# Patient Record
Sex: Female | Born: 1960 | Hispanic: No | Marital: Married | Smoking: Never smoker
Health system: Southern US, Community
[De-identification: ages and names within clinical notes are randomized; demographics above are authoritative.]

## PROBLEM LIST (undated history)

## (undated) DIAGNOSIS — M1990 Unspecified osteoarthritis, unspecified site: Secondary | ICD-10-CM

## (undated) DIAGNOSIS — F419 Anxiety disorder, unspecified: Secondary | ICD-10-CM

## (undated) DIAGNOSIS — T7840XA Allergy, unspecified, initial encounter: Secondary | ICD-10-CM

## (undated) DIAGNOSIS — E785 Hyperlipidemia, unspecified: Secondary | ICD-10-CM

## (undated) DIAGNOSIS — R011 Cardiac murmur, unspecified: Secondary | ICD-10-CM

## (undated) HISTORY — DX: Hyperlipidemia, unspecified: E78.5

## (undated) HISTORY — DX: Cardiac murmur, unspecified: R01.1

## (undated) HISTORY — DX: Allergy, unspecified, initial encounter: T78.40XA

## (undated) HISTORY — PX: ECTOPIC PREGNANCY SURGERY: SHX613

## (undated) HISTORY — PX: BILATERAL SALPINGECTOMY: SHX5743

## (undated) HISTORY — DX: Anxiety disorder, unspecified: F41.9

## (undated) HISTORY — DX: Unspecified osteoarthritis, unspecified site: M19.90

## (undated) HISTORY — PX: APPENDECTOMY: SHX54

## (undated) HISTORY — PX: TONSILLECTOMY: SUR1361

---

## 2009-01-03 ENCOUNTER — Encounter: Admission: RE | Admit: 2009-01-03 | Discharge: 2009-01-03 | Payer: Self-pay | Admitting: Family Medicine
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
## 2009-04-05 ENCOUNTER — Encounter: Admission: RE | Admit: 2009-04-05 | Discharge: 2009-04-05 | Payer: Self-pay | Admitting: Family Medicine

## 2009-12-20 ENCOUNTER — Other Ambulatory Visit: Admission: RE | Admit: 2009-12-20 | Discharge: 2009-12-20 | Payer: Self-pay | Admitting: *Deleted

## 2015-07-14 ENCOUNTER — Other Ambulatory Visit (HOSPITAL_COMMUNITY)
Admission: RE | Admit: 2015-07-14 | Discharge: 2015-07-14 | Disposition: A | Discharge status: Home / Self Care | Payer: BLUE CROSS/BLUE SHIELD | Source: Ambulatory Visit | Attending: Obstetrics & Gynecology | Admitting: Obstetrics & Gynecology

## 2015-07-14 ENCOUNTER — Other Ambulatory Visit: Payer: Self-pay

## 2015-07-14 DIAGNOSIS — Z1151 Encounter for screening for human papillomavirus (HPV): Secondary | ICD-10-CM | POA: Diagnosis present

## 2015-07-14 DIAGNOSIS — Z1231 Encounter for screening mammogram for malignant neoplasm of breast: Secondary | ICD-10-CM

## 2015-07-14 DIAGNOSIS — Z01419 Encounter for gynecological examination (general) (routine) without abnormal findings: Secondary | ICD-10-CM | POA: Insufficient documentation

## 2015-08-02 ENCOUNTER — Ambulatory Visit
Admission: RE | Admit: 2015-08-02 | Discharge: 2015-08-02 | Disposition: A | Discharge status: Home / Self Care | Payer: BLUE CROSS/BLUE SHIELD | Source: Ambulatory Visit

## 2015-08-02 DIAGNOSIS — Z1231 Encounter for screening mammogram for malignant neoplasm of breast: Secondary | ICD-10-CM

## 2015-08-04 ENCOUNTER — Other Ambulatory Visit: Payer: Self-pay | Admitting: Obstetrics & Gynecology

## 2015-08-04 DIAGNOSIS — R928 Other abnormal and inconclusive findings on diagnostic imaging of breast: Secondary | ICD-10-CM

## 2015-08-10 ENCOUNTER — Ambulatory Visit
Admission: RE | Admit: 2015-08-10 | Discharge: 2015-08-10 | Disposition: A | Discharge status: Home / Self Care | Payer: BLUE CROSS/BLUE SHIELD | Source: Ambulatory Visit | Attending: Obstetrics & Gynecology | Admitting: Obstetrics & Gynecology

## 2015-08-10 DIAGNOSIS — R928 Other abnormal and inconclusive findings on diagnostic imaging of breast: Secondary | ICD-10-CM

## 2019-10-19 ENCOUNTER — Ambulatory Visit: Payer: BLUE CROSS/BLUE SHIELD | Attending: Internal Medicine

## 2019-10-19 ENCOUNTER — Other Ambulatory Visit: Payer: BLUE CROSS/BLUE SHIELD

## 2019-10-19 DIAGNOSIS — Z20822 Contact with and (suspected) exposure to covid-19: Secondary | ICD-10-CM

## 2019-10-20 LAB — NOVEL CORONAVIRUS, NAA: SARS-CoV-2, NAA: NOT DETECTED

## 2020-08-29 DIAGNOSIS — Z1231 Encounter for screening mammogram for malignant neoplasm of breast: Secondary | ICD-10-CM | POA: Diagnosis not present

## 2020-09-27 DIAGNOSIS — R9431 Abnormal electrocardiogram [ECG] [EKG]: Secondary | ICD-10-CM | POA: Diagnosis not present

## 2020-09-27 DIAGNOSIS — R002 Palpitations: Secondary | ICD-10-CM | POA: Diagnosis not present

## 2020-09-29 DIAGNOSIS — L578 Other skin changes due to chronic exposure to nonionizing radiation: Secondary | ICD-10-CM | POA: Diagnosis not present

## 2020-09-29 DIAGNOSIS — L821 Other seborrheic keratosis: Secondary | ICD-10-CM | POA: Diagnosis not present

## 2020-09-29 DIAGNOSIS — L818 Other specified disorders of pigmentation: Secondary | ICD-10-CM | POA: Diagnosis not present

## 2020-09-29 DIAGNOSIS — D229 Melanocytic nevi, unspecified: Secondary | ICD-10-CM | POA: Diagnosis not present

## 2020-11-09 DIAGNOSIS — E782 Mixed hyperlipidemia: Secondary | ICD-10-CM | POA: Diagnosis not present

## 2020-11-09 DIAGNOSIS — R079 Chest pain, unspecified: Secondary | ICD-10-CM | POA: Diagnosis not present

## 2020-11-09 DIAGNOSIS — R0600 Dyspnea, unspecified: Secondary | ICD-10-CM | POA: Diagnosis not present

## 2020-11-11 DIAGNOSIS — R002 Palpitations: Secondary | ICD-10-CM | POA: Diagnosis not present

## 2020-11-11 DIAGNOSIS — R079 Chest pain, unspecified: Secondary | ICD-10-CM | POA: Diagnosis not present

## 2020-11-11 DIAGNOSIS — R0789 Other chest pain: Secondary | ICD-10-CM | POA: Diagnosis not present

## 2020-11-11 DIAGNOSIS — R0602 Shortness of breath: Secondary | ICD-10-CM | POA: Diagnosis not present

## 2020-11-11 DIAGNOSIS — R9431 Abnormal electrocardiogram [ECG] [EKG]: Secondary | ICD-10-CM | POA: Diagnosis not present

## 2020-11-15 DIAGNOSIS — R079 Chest pain, unspecified: Secondary | ICD-10-CM | POA: Diagnosis not present

## 2020-11-15 DIAGNOSIS — R0600 Dyspnea, unspecified: Secondary | ICD-10-CM | POA: Diagnosis not present

## 2020-11-16 DIAGNOSIS — R000 Tachycardia, unspecified: Secondary | ICD-10-CM | POA: Diagnosis not present

## 2020-11-16 DIAGNOSIS — R079 Chest pain, unspecified: Secondary | ICD-10-CM | POA: Diagnosis not present

## 2020-11-16 DIAGNOSIS — R002 Palpitations: Secondary | ICD-10-CM | POA: Diagnosis not present

## 2020-11-16 DIAGNOSIS — R0602 Shortness of breath: Secondary | ICD-10-CM | POA: Diagnosis not present

## 2020-12-07 DIAGNOSIS — R002 Palpitations: Secondary | ICD-10-CM | POA: Diagnosis not present

## 2020-12-08 DIAGNOSIS — I493 Ventricular premature depolarization: Secondary | ICD-10-CM | POA: Diagnosis not present

## 2020-12-08 DIAGNOSIS — I491 Atrial premature depolarization: Secondary | ICD-10-CM | POA: Diagnosis not present

## 2021-02-04 DIAGNOSIS — Z8739 Personal history of other diseases of the musculoskeletal system and connective tissue: Secondary | ICD-10-CM | POA: Diagnosis not present

## 2021-02-04 DIAGNOSIS — S8251XA Displaced fracture of medial malleolus of right tibia, initial encounter for closed fracture: Secondary | ICD-10-CM | POA: Diagnosis not present

## 2021-02-04 DIAGNOSIS — Y999 Unspecified external cause status: Secondary | ICD-10-CM | POA: Diagnosis not present

## 2021-02-04 DIAGNOSIS — M25571 Pain in right ankle and joints of right foot: Secondary | ICD-10-CM | POA: Diagnosis not present

## 2021-02-04 DIAGNOSIS — R600 Localized edema: Secondary | ICD-10-CM | POA: Diagnosis not present

## 2021-02-04 DIAGNOSIS — W1839XA Other fall on same level, initial encounter: Secondary | ICD-10-CM | POA: Diagnosis not present

## 2021-02-06 DIAGNOSIS — S93491A Sprain of other ligament of right ankle, initial encounter: Secondary | ICD-10-CM | POA: Diagnosis not present

## 2021-03-01 DIAGNOSIS — R29898 Other symptoms and signs involving the musculoskeletal system: Secondary | ICD-10-CM | POA: Diagnosis not present

## 2021-03-01 DIAGNOSIS — M25671 Stiffness of right ankle, not elsewhere classified: Secondary | ICD-10-CM | POA: Diagnosis not present

## 2021-03-01 DIAGNOSIS — Z7409 Other reduced mobility: Secondary | ICD-10-CM | POA: Diagnosis not present

## 2021-03-01 DIAGNOSIS — M25571 Pain in right ankle and joints of right foot: Secondary | ICD-10-CM | POA: Diagnosis not present

## 2021-03-09 DIAGNOSIS — M25671 Stiffness of right ankle, not elsewhere classified: Secondary | ICD-10-CM | POA: Diagnosis not present

## 2021-03-09 DIAGNOSIS — Z7409 Other reduced mobility: Secondary | ICD-10-CM | POA: Diagnosis not present

## 2021-03-09 DIAGNOSIS — R29898 Other symptoms and signs involving the musculoskeletal system: Secondary | ICD-10-CM | POA: Diagnosis not present

## 2021-03-09 DIAGNOSIS — M25571 Pain in right ankle and joints of right foot: Secondary | ICD-10-CM | POA: Diagnosis not present

## 2021-03-10 DIAGNOSIS — S93491D Sprain of other ligament of right ankle, subsequent encounter: Secondary | ICD-10-CM | POA: Diagnosis not present

## 2021-03-16 DIAGNOSIS — Z7409 Other reduced mobility: Secondary | ICD-10-CM | POA: Diagnosis not present

## 2021-03-16 DIAGNOSIS — M25571 Pain in right ankle and joints of right foot: Secondary | ICD-10-CM | POA: Diagnosis not present

## 2021-03-16 DIAGNOSIS — R29898 Other symptoms and signs involving the musculoskeletal system: Secondary | ICD-10-CM | POA: Diagnosis not present

## 2021-03-16 DIAGNOSIS — M25671 Stiffness of right ankle, not elsewhere classified: Secondary | ICD-10-CM | POA: Diagnosis not present

## 2021-03-29 DIAGNOSIS — M25571 Pain in right ankle and joints of right foot: Secondary | ICD-10-CM | POA: Diagnosis not present

## 2021-03-29 DIAGNOSIS — R29898 Other symptoms and signs involving the musculoskeletal system: Secondary | ICD-10-CM | POA: Diagnosis not present

## 2021-03-29 DIAGNOSIS — M25671 Stiffness of right ankle, not elsewhere classified: Secondary | ICD-10-CM | POA: Diagnosis not present

## 2021-03-29 DIAGNOSIS — Z7409 Other reduced mobility: Secondary | ICD-10-CM | POA: Diagnosis not present

## 2021-04-05 DIAGNOSIS — M25671 Stiffness of right ankle, not elsewhere classified: Secondary | ICD-10-CM | POA: Diagnosis not present

## 2021-04-05 DIAGNOSIS — M25571 Pain in right ankle and joints of right foot: Secondary | ICD-10-CM | POA: Diagnosis not present

## 2021-04-05 DIAGNOSIS — R29898 Other symptoms and signs involving the musculoskeletal system: Secondary | ICD-10-CM | POA: Diagnosis not present

## 2021-04-05 DIAGNOSIS — Z7409 Other reduced mobility: Secondary | ICD-10-CM | POA: Diagnosis not present

## 2021-04-12 DIAGNOSIS — M25671 Stiffness of right ankle, not elsewhere classified: Secondary | ICD-10-CM | POA: Diagnosis not present

## 2021-04-12 DIAGNOSIS — R29898 Other symptoms and signs involving the musculoskeletal system: Secondary | ICD-10-CM | POA: Diagnosis not present

## 2021-04-12 DIAGNOSIS — Z7409 Other reduced mobility: Secondary | ICD-10-CM | POA: Diagnosis not present

## 2021-04-12 DIAGNOSIS — M25571 Pain in right ankle and joints of right foot: Secondary | ICD-10-CM | POA: Diagnosis not present

## 2021-04-17 DIAGNOSIS — M25571 Pain in right ankle and joints of right foot: Secondary | ICD-10-CM | POA: Diagnosis not present

## 2021-04-17 DIAGNOSIS — R29898 Other symptoms and signs involving the musculoskeletal system: Secondary | ICD-10-CM | POA: Diagnosis not present

## 2021-04-17 DIAGNOSIS — M25671 Stiffness of right ankle, not elsewhere classified: Secondary | ICD-10-CM | POA: Diagnosis not present

## 2021-04-17 DIAGNOSIS — Z7409 Other reduced mobility: Secondary | ICD-10-CM | POA: Diagnosis not present

## 2021-04-19 DIAGNOSIS — M25671 Stiffness of right ankle, not elsewhere classified: Secondary | ICD-10-CM | POA: Diagnosis not present

## 2021-04-19 DIAGNOSIS — M25571 Pain in right ankle and joints of right foot: Secondary | ICD-10-CM | POA: Diagnosis not present

## 2021-04-19 DIAGNOSIS — Z7409 Other reduced mobility: Secondary | ICD-10-CM | POA: Diagnosis not present

## 2021-04-19 DIAGNOSIS — R29898 Other symptoms and signs involving the musculoskeletal system: Secondary | ICD-10-CM | POA: Diagnosis not present

## 2021-06-05 DIAGNOSIS — Z7409 Other reduced mobility: Secondary | ICD-10-CM | POA: Diagnosis not present

## 2021-06-05 DIAGNOSIS — R29898 Other symptoms and signs involving the musculoskeletal system: Secondary | ICD-10-CM | POA: Diagnosis not present

## 2021-06-05 DIAGNOSIS — M25671 Stiffness of right ankle, not elsewhere classified: Secondary | ICD-10-CM | POA: Diagnosis not present

## 2021-06-05 DIAGNOSIS — M25571 Pain in right ankle and joints of right foot: Secondary | ICD-10-CM | POA: Diagnosis not present

## 2021-06-19 DIAGNOSIS — F320 Major depressive disorder, single episode, mild: Secondary | ICD-10-CM | POA: Diagnosis not present

## 2021-06-19 DIAGNOSIS — F419 Anxiety disorder, unspecified: Secondary | ICD-10-CM | POA: Diagnosis not present

## 2021-06-19 DIAGNOSIS — Z23 Encounter for immunization: Secondary | ICD-10-CM | POA: Diagnosis not present

## 2021-06-19 DIAGNOSIS — Z1329 Encounter for screening for other suspected endocrine disorder: Secondary | ICD-10-CM | POA: Diagnosis not present

## 2021-06-19 DIAGNOSIS — Z0000 Encounter for general adult medical examination without abnormal findings: Secondary | ICD-10-CM | POA: Diagnosis not present

## 2021-06-19 DIAGNOSIS — E782 Mixed hyperlipidemia: Secondary | ICD-10-CM | POA: Diagnosis not present

## 2021-06-26 DIAGNOSIS — M1711 Unilateral primary osteoarthritis, right knee: Secondary | ICD-10-CM | POA: Diagnosis not present

## 2021-06-26 DIAGNOSIS — M25561 Pain in right knee: Secondary | ICD-10-CM | POA: Diagnosis not present

## 2021-07-20 DIAGNOSIS — G8929 Other chronic pain: Secondary | ICD-10-CM | POA: Diagnosis not present

## 2021-07-20 DIAGNOSIS — S83231A Complex tear of medial meniscus, current injury, right knee, initial encounter: Secondary | ICD-10-CM | POA: Diagnosis not present

## 2021-07-20 DIAGNOSIS — M1711 Unilateral primary osteoarthritis, right knee: Secondary | ICD-10-CM | POA: Diagnosis not present

## 2021-07-20 DIAGNOSIS — M25561 Pain in right knee: Secondary | ICD-10-CM | POA: Diagnosis not present

## 2021-07-31 DIAGNOSIS — S83231D Complex tear of medial meniscus, current injury, right knee, subsequent encounter: Secondary | ICD-10-CM | POA: Diagnosis not present

## 2021-08-17 HISTORY — PX: KNEE ARTHROSCOPY: SHX127

## 2021-09-01 DIAGNOSIS — X58XXXA Exposure to other specified factors, initial encounter: Secondary | ICD-10-CM | POA: Diagnosis not present

## 2021-09-01 DIAGNOSIS — S83231A Complex tear of medial meniscus, current injury, right knee, initial encounter: Secondary | ICD-10-CM | POA: Diagnosis not present

## 2021-09-01 DIAGNOSIS — M2391 Unspecified internal derangement of right knee: Secondary | ICD-10-CM | POA: Diagnosis not present

## 2021-09-07 DIAGNOSIS — R29898 Other symptoms and signs involving the musculoskeletal system: Secondary | ICD-10-CM | POA: Diagnosis not present

## 2021-09-07 DIAGNOSIS — Z7409 Other reduced mobility: Secondary | ICD-10-CM | POA: Diagnosis not present

## 2021-09-07 DIAGNOSIS — S83231D Complex tear of medial meniscus, current injury, right knee, subsequent encounter: Secondary | ICD-10-CM | POA: Diagnosis not present

## 2021-09-07 DIAGNOSIS — M25561 Pain in right knee: Secondary | ICD-10-CM | POA: Diagnosis not present

## 2021-09-28 DIAGNOSIS — M25561 Pain in right knee: Secondary | ICD-10-CM | POA: Diagnosis not present

## 2021-09-28 DIAGNOSIS — R29898 Other symptoms and signs involving the musculoskeletal system: Secondary | ICD-10-CM | POA: Diagnosis not present

## 2021-09-28 DIAGNOSIS — S83231D Complex tear of medial meniscus, current injury, right knee, subsequent encounter: Secondary | ICD-10-CM | POA: Diagnosis not present

## 2021-09-28 DIAGNOSIS — Z7409 Other reduced mobility: Secondary | ICD-10-CM | POA: Diagnosis not present

## 2021-12-18 DIAGNOSIS — E782 Mixed hyperlipidemia: Secondary | ICD-10-CM | POA: Diagnosis not present

## 2021-12-18 DIAGNOSIS — F419 Anxiety disorder, unspecified: Secondary | ICD-10-CM | POA: Diagnosis not present

## 2021-12-18 DIAGNOSIS — Z1231 Encounter for screening mammogram for malignant neoplasm of breast: Secondary | ICD-10-CM | POA: Diagnosis not present

## 2022-01-11 DIAGNOSIS — L818 Other specified disorders of pigmentation: Secondary | ICD-10-CM | POA: Diagnosis not present

## 2022-01-11 DIAGNOSIS — D229 Melanocytic nevi, unspecified: Secondary | ICD-10-CM | POA: Diagnosis not present

## 2022-01-11 DIAGNOSIS — L821 Other seborrheic keratosis: Secondary | ICD-10-CM | POA: Diagnosis not present

## 2022-01-11 DIAGNOSIS — L578 Other skin changes due to chronic exposure to nonionizing radiation: Secondary | ICD-10-CM | POA: Diagnosis not present

## 2022-02-23 DIAGNOSIS — E781 Pure hyperglyceridemia: Secondary | ICD-10-CM | POA: Diagnosis not present

## 2022-02-23 DIAGNOSIS — Z6831 Body mass index (BMI) 31.0-31.9, adult: Secondary | ICD-10-CM | POA: Diagnosis not present

## 2022-02-23 DIAGNOSIS — E669 Obesity, unspecified: Secondary | ICD-10-CM | POA: Diagnosis not present

## 2022-02-23 DIAGNOSIS — F411 Generalized anxiety disorder: Secondary | ICD-10-CM | POA: Diagnosis not present

## 2022-02-23 DIAGNOSIS — F5101 Primary insomnia: Secondary | ICD-10-CM | POA: Diagnosis not present

## 2022-03-05 DIAGNOSIS — E781 Pure hyperglyceridemia: Secondary | ICD-10-CM | POA: Diagnosis not present

## 2022-03-05 DIAGNOSIS — F5101 Primary insomnia: Secondary | ICD-10-CM | POA: Diagnosis not present

## 2022-03-05 DIAGNOSIS — Z6831 Body mass index (BMI) 31.0-31.9, adult: Secondary | ICD-10-CM | POA: Diagnosis not present

## 2022-03-05 DIAGNOSIS — E669 Obesity, unspecified: Secondary | ICD-10-CM | POA: Diagnosis not present

## 2022-03-26 DIAGNOSIS — F5101 Primary insomnia: Secondary | ICD-10-CM | POA: Diagnosis not present

## 2022-03-26 DIAGNOSIS — E669 Obesity, unspecified: Secondary | ICD-10-CM | POA: Diagnosis not present

## 2022-03-26 DIAGNOSIS — Z1231 Encounter for screening mammogram for malignant neoplasm of breast: Secondary | ICD-10-CM | POA: Diagnosis not present

## 2022-03-26 DIAGNOSIS — R7309 Other abnormal glucose: Secondary | ICD-10-CM | POA: Diagnosis not present

## 2022-03-26 DIAGNOSIS — R631 Polydipsia: Secondary | ICD-10-CM | POA: Diagnosis not present

## 2022-03-26 DIAGNOSIS — R350 Frequency of micturition: Secondary | ICD-10-CM | POA: Diagnosis not present

## 2022-03-26 DIAGNOSIS — Z6831 Body mass index (BMI) 31.0-31.9, adult: Secondary | ICD-10-CM | POA: Diagnosis not present

## 2022-03-26 DIAGNOSIS — F411 Generalized anxiety disorder: Secondary | ICD-10-CM | POA: Diagnosis not present

## 2022-03-26 DIAGNOSIS — R110 Nausea: Secondary | ICD-10-CM | POA: Diagnosis not present

## 2022-04-23 DIAGNOSIS — F411 Generalized anxiety disorder: Secondary | ICD-10-CM | POA: Diagnosis not present

## 2022-04-23 DIAGNOSIS — R0789 Other chest pain: Secondary | ICD-10-CM | POA: Diagnosis not present

## 2022-04-23 DIAGNOSIS — F5101 Primary insomnia: Secondary | ICD-10-CM | POA: Diagnosis not present

## 2022-05-16 DIAGNOSIS — I209 Angina pectoris, unspecified: Secondary | ICD-10-CM | POA: Diagnosis not present

## 2022-05-16 DIAGNOSIS — E785 Hyperlipidemia, unspecified: Secondary | ICD-10-CM | POA: Diagnosis not present

## 2022-05-16 DIAGNOSIS — E669 Obesity, unspecified: Secondary | ICD-10-CM | POA: Diagnosis not present

## 2022-05-16 DIAGNOSIS — F411 Generalized anxiety disorder: Secondary | ICD-10-CM | POA: Diagnosis not present

## 2022-05-16 DIAGNOSIS — Z6831 Body mass index (BMI) 31.0-31.9, adult: Secondary | ICD-10-CM | POA: Diagnosis not present

## 2022-05-25 DIAGNOSIS — R079 Chest pain, unspecified: Secondary | ICD-10-CM | POA: Diagnosis not present

## 2022-05-25 DIAGNOSIS — I2583 Coronary atherosclerosis due to lipid rich plaque: Secondary | ICD-10-CM | POA: Diagnosis not present

## 2022-05-25 DIAGNOSIS — R072 Precordial pain: Secondary | ICD-10-CM | POA: Diagnosis not present

## 2022-05-25 DIAGNOSIS — E785 Hyperlipidemia, unspecified: Secondary | ICD-10-CM | POA: Diagnosis not present

## 2022-05-25 DIAGNOSIS — I2510 Atherosclerotic heart disease of native coronary artery without angina pectoris: Secondary | ICD-10-CM | POA: Diagnosis not present

## 2022-05-25 DIAGNOSIS — Z6830 Body mass index (BMI) 30.0-30.9, adult: Secondary | ICD-10-CM | POA: Diagnosis not present

## 2022-05-25 DIAGNOSIS — E669 Obesity, unspecified: Secondary | ICD-10-CM | POA: Diagnosis not present

## 2022-05-25 DIAGNOSIS — F419 Anxiety disorder, unspecified: Secondary | ICD-10-CM | POA: Diagnosis not present

## 2022-05-25 DIAGNOSIS — R0789 Other chest pain: Secondary | ICD-10-CM | POA: Diagnosis not present

## 2022-06-20 DIAGNOSIS — E785 Hyperlipidemia, unspecified: Secondary | ICD-10-CM | POA: Diagnosis not present

## 2022-06-20 DIAGNOSIS — I209 Angina pectoris, unspecified: Secondary | ICD-10-CM | POA: Diagnosis not present

## 2022-07-05 ENCOUNTER — Encounter: Payer: Self-pay | Admitting: Family Medicine

## 2022-07-05 ENCOUNTER — Ambulatory Visit (INDEPENDENT_AMBULATORY_CARE_PROVIDER_SITE_OTHER): Payer: BC Managed Care – PPO | Admitting: Family Medicine

## 2022-07-05 VITALS — BP 112/74 | HR 58 | Temp 97.8°F | Ht 61.0 in | Wt 165.2 lb

## 2022-07-05 DIAGNOSIS — F411 Generalized anxiety disorder: Secondary | ICD-10-CM

## 2022-07-05 DIAGNOSIS — R600 Localized edema: Secondary | ICD-10-CM | POA: Diagnosis not present

## 2022-07-05 DIAGNOSIS — E7800 Pure hypercholesterolemia, unspecified: Secondary | ICD-10-CM

## 2022-07-05 DIAGNOSIS — F5101 Primary insomnia: Secondary | ICD-10-CM

## 2022-07-05 DIAGNOSIS — I209 Angina pectoris, unspecified: Secondary | ICD-10-CM

## 2022-07-05 DIAGNOSIS — Z6831 Body mass index (BMI) 31.0-31.9, adult: Secondary | ICD-10-CM

## 2022-07-05 DIAGNOSIS — E6609 Other obesity due to excess calories: Secondary | ICD-10-CM

## 2022-07-05 MED ORDER — MIRTAZAPINE 7.5 MG PO TABS: 7.5000 mg | ORAL_TABLET | Freq: Every day | ORAL | 1 refills | Status: DC

## 2022-07-05 MED ORDER — AMLODIPINE BESYLATE 2.5 MG PO TABS: 2.5000 mg | ORAL_TABLET | Freq: Every day | ORAL | 1 refills | Status: DC

## 2022-07-05 MED ORDER — AZELASTINE HCL 0.1 % NA SOLN: 2.0000 | Freq: Two times a day (BID) | NASAL | 12 refills | Status: DC

## 2022-07-05 MED ORDER — ATORVASTATIN CALCIUM 40 MG PO TABS: 40.0000 mg | ORAL_TABLET | Freq: Every day | ORAL | 3 refills | Status: DC

## 2022-07-05 MED ORDER — ESCITALOPRAM OXALATE 20 MG PO TABS: 20.0000 mg | ORAL_TABLET | Freq: Every day | ORAL | 3 refills | Status: DC

## 2022-07-05 NOTE — Patient Instructions (Addendum)
Welcome to [facility]! It was a pleasure meeting you today.  As discussed, Please schedule a 1 month follow up visit today.  For mirtazipine-try 1 at night for sleep and if not work, can try 2  PLEASE NOTE:  If you had any LAB tests please let us know if you have not heard back within a few days. You may see your results on MyChart before we have a chance to review them but we will give you a call once they are reviewed by [place]. If we ordered any REFERRALS today, please let us know if you have not heard from their office within the next week.  Let us know through MyChart if you are needing REFILLS, or have your pharmacy send [place] the request. You can also use MyChart to communicate with me or any office staff.  Please try these tips to maintain a healthy lifestyle:  Eat most of your calories during the day when you are active. Eliminate processed foods including packaged sweets (pies, cakes, cookies), reduce intake of potatoes, white bread, white pasta, and white rice. Look for whole grain options, oat flour or almond flour.  Each meal should contain half fruits/vegetables, one quarter protein, and one quarter carbs (no bigger than a computer mouse).  Cut down on sweet beverages. This includes juice, soda, and sweet tea. Also watch fruit intake, though this is a healthier sweet option, it still contains natural sugar! Limit to 3 servings daily.  Drink at least 1 glass of water with each meal and aim for at least 8 glasses per day  Exercise at least 150 minutes every week.

## 2022-07-05 NOTE — Progress Notes (Signed)
New Patient Office Visit  Subjective:  Patient ID: Debra Martinez, female    DOB: 1960-11-25  Age: 61 y.o. MRN: [id number]  CC:  Chief Complaint  Patient presents with   Establish Care    Need new pcp Had coffee, no food     HPI Debra Martinez presents for new pt-changed ins and wants MD.  From [place]  Cp at times-not heartburn.  Had cath-min CAD. Can be at rest or w/activity. No rad.  Some DOE.  Bp can be up at times(145/90).    Pt is active.  Walks and elliptical.  Now sit ups    Chronic, mild edema on R at times Obesity-good diet/exercise-just can't lose wt.  Very frustrated. Phentermine-anx, wegovy-too expensive. Doesn't want to drive to [place]. Willing to wait for [place].  Anxiety-lexapro works well.  But situation in [place] getting to her.  Can't sleep.  Hydroxyzine not help in past. Ambien-doesn't want. Trazodone not work. No SI Hld-12/22/21  tc 146 trig 234 hdl 39 ldl47   Past Medical History:  Diagnosis Date   Allergy    Anxiety    Arthritis    Heart murmur    Hyperlipidemia     Past Surgical History:  Procedure Laterality Date   APPENDECTOMY     BILATERAL SALPINGECTOMY     ECTOPIC PREGNANCY SURGERY     KNEE ARTHROSCOPY Right 08/2021   TONSILLECTOMY      Family History  Problem Relation Age of Onset   Drug abuse Father    Alcohol abuse Father    Heart disease Maternal Grandmother    Cancer Maternal Grandmother    Heart disease Maternal Grandfather    Hyperlipidemia Paternal Grandfather    Heart disease Paternal Grandfather     Social History   Socioeconomic History   Marital status: Married    Spouse name: Not on file   Number of children: 3   Years of education: Not on file   Highest education level: Not on file  Occupational History   Not on file  Tobacco Use   Smoking status: Never   Smokeless tobacco: Never  Vaping Use   Vaping Use: Never used  Substance and Sexual Activity   Alcohol use: Yes    Alcohol/week: 3.0 - 4.0 standard drinks of  alcohol    Types: 3 - 4 Glasses of wine per week   Drug use: Never   Sexual activity: Yes    Birth control/protection: None  Other Topics Concern   Not on file  Social History Narrative   1 biological,  2 adopted.    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   Social Determinants of Health   Financial Resource Strain: Not on file  Food Insecurity: Not on file  Transportation Needs: Not on file  Physical Activity: Not on file  Stress: Not on file  Social Connections: Not on file  Intimate Partner Violence: Not on file    ROS  ROS: Gen: no fever, chills  Skin: no rash, itching ENT: no ear pain, ear drainage, nasal congestion, rhinorrhea, sinus pressure, sore throat.  Snores.  Flonase not working Eyes: no blurry vision, double vision Resp: no cough, wheeze,SOB CV: no CP, palpitations, LE edema,  GI: no heartburn, n/v/d/c, abd pain GU: no dysuria, urgency, frequency, hematuria MSK: R knee and hands arthritis Neuro: no dizziness, headache, weakness, vertigo Psych: HPI  Objective:   Today's Vitals: BP 112/74   Pulse (!) 58   Temp 97.8 F (36.6 C) (Temporal)   Ht  5\' 1"  (1.549 m)   Wt 165 lb 4 oz (75 kg)   SpO2 94%   BMI 31.22 kg/m   Physical Exam  Gen: WDWN NAD HEENT: NCAT, conjunctiva not injected, sclera nonicteric TM WNL B, OP moist, no exudates  NECK:  supple, no thyromegaly, no nodes, no carotid bruits CARDIAC: RRR, S1S2+, no murmur. DP 2+B LUNGS: CTAB. No wheezes ABDOMEN:  BS+, soft, NTND, No HSM, no masses EXT: 1+ RLE edema MSK: no gross abnormalities.  NEURO: A&O x3.  CN II-XII intact.  PSYCH: anxious mood. Good eye contact   Assessment & Plan:   Problem List Items Addressed This Visit       Cardiovascular and Mediastinum   Angina pectoris (HCC)   Relevant Medications   nitroGLYCERIN (NITROSTAT) 0.4 MG SL tablet   atorvastatin (LIPITOR) 40 MG tablet   amLODipine (NORVASC) 2.5 MG tablet     Other   Pure hypercholesterolemia - Primary    Relevant Medications   nitroGLYCERIN (NITROSTAT) 0.4 MG SL tablet   atorvastatin (LIPITOR) 40 MG tablet   amLODipine (NORVASC) 2.5 MG tablet   GAD (generalized anxiety disorder)   Relevant Medications   escitalopram (LEXAPRO) 20 MG tablet   mirtazapine (REMERON) 7.5 MG tablet   Primary insomnia   Localized edema   Other Visit Diagnoses     Class 1 obesity due to excess calories with serious comorbidity and body mass index (BMI) of 31.0 to 31.9 in adult          Angina-had cath-mild non-obs dz.  Occ ntg.  Discussed amlodipine 2.5mg  to keep blood vessels open-she would like to try. HLD-chronic.  Mixed.  Controlled on lipitor 40mg -renewed.  Reviewed labs on her phone from April and Sept   LDL<50.  Trigs a little up-she has good diet/exercise RLE edema-watch for inc on amlodipine(doubt as low dose) GAD-chronic.  Stable on lexapro 20mg -renewed.  Flaring some d/t events in May Insomnia-chronic.  No relief w/meds in HPI nor OTC nor herbal teas.  Will trial mirtazipine 7.5mg .  can inc to 15mg . Snoring-try astelin Obesity w/co-morbidities-SE to meds or cost.  Pt exercises, good diet.  Very frustrated.  Wants to go to wt loss clinic in [place]-aware of wait list.  F/u 1 mo  Outpatient Encounter Medications as of 07/05/2022  Medication Sig   amLODipine (NORVASC) 2.5 MG tablet Take 1 tablet (2.5 mg total) by mouth daily.   azelastine (ASTELIN) 0.1 % nasal spray Place 2 sprays into both nostrils 2 (two) times daily.   DOXYLAMINE SUCCINATE PO Take by mouth.   fexofenadine (ALLEGRA) 180 MG tablet Take by mouth.   fluticasone (FLONASE) 50 MCG/ACT nasal spray Place into both nostrils daily.   Magnesium 400 MG TABS Take by mouth.   mirtazapine (REMERON) 7.5 MG tablet Take 1 tablet (7.5 mg total) by mouth at bedtime.   nitroGLYCERIN (NITROSTAT) 0.4 MG SL tablet Place under the tongue.   [DISCONTINUED] atorvastatin (LIPITOR) 40 MG tablet Take by mouth.   [DISCONTINUED] escitalopram (LEXAPRO) 20 MG  tablet Take 20 mg by mouth daily.   atorvastatin (LIPITOR) 40 MG tablet Take 1 tablet (40 mg total) by mouth daily.   escitalopram (LEXAPRO) 20 MG tablet Take 1 tablet (20 mg total) by mouth daily.   No facility-administered encounter medications on file as of 07/05/2022.    Follow-up: Return in about 4 weeks (around 08/02/2022) for insomnia and cp.   , MD

## 2022-07-10 ENCOUNTER — Emergency Department (HOSPITAL_BASED_OUTPATIENT_CLINIC_OR_DEPARTMENT_OTHER): Payer: BC Managed Care – PPO

## 2022-07-10 ENCOUNTER — Other Ambulatory Visit: Payer: Self-pay

## 2022-07-10 ENCOUNTER — Emergency Department (HOSPITAL_BASED_OUTPATIENT_CLINIC_OR_DEPARTMENT_OTHER)
Admission: EM | Admit: 2022-07-10 | Discharge: 2022-07-10 | Disposition: A | Discharge status: Home / Self Care | Payer: BC Managed Care – PPO | Attending: Emergency Medicine | Admitting: Emergency Medicine

## 2022-07-10 DIAGNOSIS — S0990XA Unspecified injury of head, initial encounter: Secondary | ICD-10-CM | POA: Insufficient documentation

## 2022-07-10 DIAGNOSIS — S161XXA Strain of muscle, fascia and tendon at neck level, initial encounter: Secondary | ICD-10-CM | POA: Insufficient documentation

## 2022-07-10 DIAGNOSIS — Y92410 Unspecified street and highway as the place of occurrence of the external cause: Secondary | ICD-10-CM | POA: Insufficient documentation

## 2022-07-10 DIAGNOSIS — M549 Dorsalgia, unspecified: Secondary | ICD-10-CM | POA: Insufficient documentation

## 2022-07-10 DIAGNOSIS — I6789 Other cerebrovascular disease: Secondary | ICD-10-CM | POA: Diagnosis not present

## 2022-07-10 DIAGNOSIS — Z041 Encounter for examination and observation following transport accident: Secondary | ICD-10-CM | POA: Diagnosis not present

## 2022-07-10 MED ORDER — METHOCARBAMOL 500 MG PO TABS: 500.0000 mg | ORAL_TABLET | Freq: Three times a day (TID) | ORAL | 0 refills | Status: DC | PRN

## 2022-07-10 MED ORDER — KETOROLAC TROMETHAMINE 30 MG/ML IJ SOLN
15.0000 mg | Freq: Once | INTRAMUSCULAR | Status: AC
  Administered 2022-07-10: 15 mg via INTRAMUSCULAR
  Filled 2022-07-10: qty 1

## 2022-07-10 MED ORDER — METHOCARBAMOL 500 MG PO TABS
500.0000 mg | ORAL_TABLET | Freq: Once | ORAL | Status: AC
  Administered 2022-07-10: 500 mg via ORAL
  Filled 2022-07-10: qty 1
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
## 2022-07-10 NOTE — ED Notes (Signed)
Reviewed AVS/discharge instruction with patient. Time allotted for and all questions answered. Patient is agreeable for d/c and escorted to ed exit by staff.  
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
## 2022-07-12 ENCOUNTER — Ambulatory Visit (INDEPENDENT_AMBULATORY_CARE_PROVIDER_SITE_OTHER): Payer: BC Managed Care – PPO | Admitting: Family

## 2022-07-12 ENCOUNTER — Encounter: Payer: Self-pay | Admitting: Family

## 2022-07-12 VITALS — BP 132/85 | HR 55 | Temp 97.8°F | Ht 61.0 in | Wt 168.1 lb

## 2022-07-12 DIAGNOSIS — M542 Cervicalgia: Secondary | ICD-10-CM | POA: Diagnosis not present

## 2022-07-12 MED ORDER — NAPROXEN 500 MG PO TABS: 500.0000 mg | ORAL_TABLET | Freq: Two times a day (BID) | ORAL | 0 refills | Status: DC
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
## 2022-07-12 NOTE — Patient Instructions (Addendum)
It was very nice to see you today!   I have sent Naproxen to help with your neck and back pain.  Also recommend trying a heating pad for up to 30 minutes several times per day.   I would try the Methocarbamol (Robaxin) muscle relaxer given to you by the ER providers, take 1 pill after eating up to 3 times per day - start at home first as has a possiblity of making you drowsy.  Let us know if you are not getting better with your pain.       PLEASE NOTE:  If you had any lab tests please let us know if you have not heard back within a few days. You may see your results on MyChart before we have a chance to review them but we will give you a call once they are reviewed by [place]. If we ordered any referrals today, please let us know if you have not heard from their office within the next week.

## 2022-08-06 ENCOUNTER — Encounter: Payer: Self-pay | Admitting: Family Medicine

## 2022-08-06 ENCOUNTER — Ambulatory Visit (INDEPENDENT_AMBULATORY_CARE_PROVIDER_SITE_OTHER): Payer: BC Managed Care – PPO | Admitting: Family Medicine

## 2022-08-06 VITALS — BP 130/74 | HR 61 | Temp 98.5°F | Ht 61.0 in | Wt 165.2 lb

## 2022-08-06 DIAGNOSIS — M542 Cervicalgia: Secondary | ICD-10-CM

## 2022-08-06 DIAGNOSIS — I209 Angina pectoris, unspecified: Secondary | ICD-10-CM

## 2022-08-06 DIAGNOSIS — F5101 Primary insomnia: Secondary | ICD-10-CM

## 2022-08-06 MED ORDER — MIRTAZAPINE 15 MG PO TABS: 15.0000 mg | ORAL_TABLET | Freq: Every day | ORAL | 3 refills | Status: DC

## 2022-08-06 MED ORDER — METHYLPREDNISOLONE ACETATE 80 MG/ML IJ SUSP
80.0000 mg | Freq: Once | INTRAMUSCULAR | Status: AC
  Administered 2022-08-06: 80 mg via INTRAMUSCULAR

## 2022-08-06 NOTE — Patient Instructions (Signed)
It was very nice to see you today!  Happy holidays!  Referring to physical therapy  Increase mirtazipine to 15mg .    PLEASE NOTE:  If you had any lab tests please let know if you have not heard back within a few days. You may see your results on MyChart before we have a chance to review them but we will give you a call once they are reviewed by [place]. If we ordered any referrals today, please let us know if you have not heard from their office within the next week.   Please try these tips to maintain a healthy lifestyle:  Eat most of your calories during the day when you are active. Eliminate processed foods including packaged sweets (pies, cakes, cookies), reduce intake of potatoes, white bread, white pasta, and white rice. Look for whole grain options, oat flour or almond flour.  Each meal should contain half fruits/vegetables, one quarter protein, and one quarter carbs (no bigger than a computer mouse).  Cut down on sweet beverages. This includes juice, soda, and sweet tea. Also watch fruit intake, though this is a healthier sweet option, it still contains natural sugar! Limit to 3 servings daily.  Drink at least 1 glass of water with each meal and aim for at least 8 glasses per day  Exercise at least 150 minutes every week.
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
## 2022-08-16 DIAGNOSIS — E785 Hyperlipidemia, unspecified: Secondary | ICD-10-CM | POA: Diagnosis not present

## 2022-08-16 DIAGNOSIS — R072 Precordial pain: Secondary | ICD-10-CM | POA: Diagnosis not present

## 2022-08-16 DIAGNOSIS — F419 Anxiety disorder, unspecified: Secondary | ICD-10-CM | POA: Diagnosis not present

## 2022-08-20 DIAGNOSIS — Z6829 Body mass index (BMI) 29.0-29.9, adult: Secondary | ICD-10-CM | POA: Diagnosis not present

## 2022-08-20 DIAGNOSIS — Z7182 Exercise counseling: Secondary | ICD-10-CM | POA: Diagnosis not present

## 2022-08-20 DIAGNOSIS — E663 Overweight: Secondary | ICD-10-CM | POA: Diagnosis not present

## 2022-08-20 DIAGNOSIS — Z13228 Encounter for screening for other metabolic disorders: Secondary | ICD-10-CM | POA: Diagnosis not present

## 2022-08-20 DIAGNOSIS — Z713 Dietary counseling and surveillance: Secondary | ICD-10-CM | POA: Diagnosis not present

## 2022-08-20 DIAGNOSIS — F3289 Other specified depressive episodes: Secondary | ICD-10-CM | POA: Diagnosis not present

## 2022-08-21 ENCOUNTER — Encounter: Payer: Self-pay | Admitting: Family Medicine

## 2022-08-21 ENCOUNTER — Ambulatory Visit (INDEPENDENT_AMBULATORY_CARE_PROVIDER_SITE_OTHER): Payer: BC Managed Care – PPO | Admitting: Family Medicine

## 2022-08-21 VITALS — BP 130/70 | HR 62 | Temp 98.3°F | Ht 62.0 in | Wt 162.1 lb

## 2022-08-21 DIAGNOSIS — I209 Angina pectoris, unspecified: Secondary | ICD-10-CM | POA: Diagnosis not present

## 2022-08-21 DIAGNOSIS — J0100 Acute maxillary sinusitis, unspecified: Secondary | ICD-10-CM

## 2022-08-21 MED ORDER — METHOCARBAMOL 500 MG PO TABS: 500.0000 mg | ORAL_TABLET | Freq: Three times a day (TID) | ORAL | 0 refills | Status: DC | PRN

## 2022-08-21 MED ORDER — DOXYCYCLINE HYCLATE 100 MG PO TABS: 100.0000 mg | ORAL_TABLET | Freq: Two times a day (BID) | ORAL | 0 refills | Status: DC

## 2022-08-21 NOTE — Progress Notes (Signed)
Subjective:     Patient ID: Debra Martinez, female    DOB: Mar 20, 1961, 61 y.o.   MRN: [id number]  Chief Complaint  Patient presents with   Ear Pain    Left ear pressure   Nasal Congestion   Headache    Left sided   Fever    Had a slight temp this morning, took Tylenol    HPI Congestion since 11/25.  Husb had as well.  His covid was neg x2.  A lot of coughing, some sob/weak/tired.  Taking theraflu cough syrup. Past 2 days L ear pressure, L sided HA.  Slight temp this am 37.9 and took tylenol about 3 hrs ago. 100.2. No rash.  Some diarrhea for 1.5 days and again today. Some nausea. No vomit.   Health Maintenance Due  Topic Date Due   HIV Screening  Never done   Hepatitis C Screening  Never done   DTaP/Tdap/Td (1 - Tdap) Never done   PAP SMEAR-Modifier  Never done   MAMMOGRAM  08/01/2017    Past Medical History:  Diagnosis Date   Allergy    Anxiety    Arthritis    Heart murmur    Hyperlipidemia     Past Surgical History:  Procedure Laterality Date   APPENDECTOMY     BILATERAL SALPINGECTOMY     ECTOPIC PREGNANCY SURGERY     KNEE ARTHROSCOPY Right 08/2021   TONSILLECTOMY      Outpatient Medications Prior to Visit  Medication Sig Dispense Refill   atorvastatin (LIPITOR) 40 MG tablet Take 1 tablet (40 mg total) by mouth daily. 90 tablet 3   azelastine (ASTELIN) 0.1 % nasal spray Place 2 sprays into both nostrils 2 (two) times daily. 30 mL 12   DOXYLAMINE SUCCINATE PO Take by mouth.     escitalopram (LEXAPRO) 20 MG tablet Take 1 tablet (20 mg total) by mouth daily. 90 tablet 3   fexofenadine (ALLEGRA) 180 MG tablet Take by mouth.     fluticasone (FLONASE) 50 MCG/ACT nasal spray Place into both nostrils daily.     isosorbide mononitrate (IMDUR) 30 MG 24 hr tablet Take by mouth.     Magnesium 400 MG TABS Take by mouth.     mirtazapine (REMERON) 15 MG tablet Take 1 tablet (15 mg total) by mouth at bedtime. 90 tablet 3   naproxen (NAPROSYN) 500 MG tablet Take 1 tablet  (500 mg total) by mouth 2 (two) times daily with a meal. 30 tablet 0   nitroGLYCERIN (NITROSTAT) 0.4 MG SL tablet Place under the tongue.     methocarbamol (ROBAXIN) 500 MG tablet Take 1 tablet (500 mg total) by mouth every 8 (eight) hours as needed for muscle spasms. 20 tablet 0   No facility-administered medications prior to visit.    Allergies  Allergen Reactions   Penicillin G Anaphylaxis   Sulfa Antibiotics Other (See Comments)    Other reaction(s): Dizziness (intolerance)   ROS neg/noncontributory except as noted HPI/below R neck spasms coming back      Objective:     BP 130/70   Pulse 62   Temp 98.3 F (36.8 C) (Temporal)   Ht 5\' 2"  (1.575 m)   Wt 162 lb 2 oz (73.5 kg)   SpO2 96%   BMI 29.65 kg/m  Wt Readings from Last 3 Encounters:  08/21/22 162 lb 2 oz (73.5 kg)  08/06/22 165 lb 4 oz (75 kg)  07/12/22 168 lb 2 oz (76.3 kg)    Physical Exam  Gen: WDWN NAD HEENT: NCAT, conjunctiva not injected, sclera nonicteric TM WNL B, OP moist, no exudates  very tender to percussion L max/frontal sinus NECK:  supple, no thyromegaly, no nodes, no carotid bruits CARDIAC: RRR, S1S2+, no murmur.  LUNGS: CTAB. No wheezes EXT:  no edema MSK: no gross abnormalities.  NEURO: A&O x3.  CN II-XII intact.  PSYCH: normal mood. Good eye contact.  Anxious.  Twitching some     Assessment & Plan:   Problem List Items Addressed This Visit   None Visit Diagnoses     Acute non-recurrent maxillary sinusitis    -  Primary   Relevant Medications   doxycycline (VIBRA-TABS) 100 MG tablet      L maxillary sinusitis-doxy 100mg  bid. Coronary spasm-better on Imdur.  Wants to xfer to .   Referral done  Meds ordered this encounter  Medications   doxycycline (VIBRA-TABS) 100 MG tablet    Sig: Take 1 tablet (100 mg total) by mouth 2 (two) times daily.    Dispense:  20 tablet    Refill:  0   methocarbamol (ROBAXIN) 500 MG tablet    Sig: Take 1 tablet (500 mg total) by mouth  every 8 (eight) hours as needed for muscle spasms.    Dispense:  20 tablet    Refill:  0    [employer], MD

## 2022-08-21 NOTE — Patient Instructions (Signed)
It was very nice to see you today!  Happy holidays!  Worse, not improving, let me know   PLEASE NOTE:  If you had any lab tests please let us know if you have not heard back within a few days. You may see your results on MyChart before we have a chance to review them but we will give you a call once they are reviewed by [place]. If we ordered any referrals today, please let us know if you have not heard from their office within the next week.   Please try these tips to maintain a healthy lifestyle:  Eat most of your calories during the day when you are active. Eliminate processed foods including packaged sweets (pies, cakes, cookies), reduce intake of potatoes, white bread, white pasta, and white rice. Look for whole grain options, oat flour or almond flour.  Each meal should contain half fruits/vegetables, one quarter protein, and one quarter carbs (no bigger than a computer mouse).  Cut down on sweet beverages. This includes juice, soda, and sweet tea. Also watch fruit intake, though this is a healthier sweet option, it still contains natural sugar! Limit to 3 servings daily.  Drink at least 1 glass of water with each meal and aim for at least 8 glasses per day  Exercise at least 150 minutes every week.

## 2022-08-22 ENCOUNTER — Ambulatory Visit: Payer: BC Managed Care – PPO | Admitting: Physical Therapy

## 2022-10-02 ENCOUNTER — Other Ambulatory Visit: Payer: Self-pay | Admitting: Family Medicine

## 2022-11-25 ENCOUNTER — Other Ambulatory Visit: Payer: Self-pay | Admitting: Family Medicine

## 2022-12-11 ENCOUNTER — Encounter: Payer: Self-pay | Admitting: Family Medicine

## 2022-12-11 ENCOUNTER — Ambulatory Visit: Payer: BC Managed Care – PPO | Admitting: Family Medicine

## 2022-12-11 VITALS — BP 118/80 | HR 81 | Temp 97.6°F | Ht 62.0 in | Wt 166.2 lb

## 2022-12-11 DIAGNOSIS — F4310 Post-traumatic stress disorder, unspecified: Secondary | ICD-10-CM

## 2022-12-11 DIAGNOSIS — F411 Generalized anxiety disorder: Secondary | ICD-10-CM

## 2022-12-11 DIAGNOSIS — J301 Allergic rhinitis due to pollen: Secondary | ICD-10-CM

## 2022-12-11 DIAGNOSIS — M542 Cervicalgia: Secondary | ICD-10-CM

## 2022-12-11 MED ORDER — IPRATROPIUM BROMIDE 0.06 % NA SOLN: 2.0000 | Freq: Four times a day (QID) | NASAL | 1 refills | Status: DC
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
## 2022-12-11 NOTE — Patient Instructions (Addendum)
Try claritin rather than allegra.     Add ipratroprium.    Referral sent to psych and PT  [facility]

## 2022-12-24 ENCOUNTER — Ambulatory Visit (INDEPENDENT_AMBULATORY_CARE_PROVIDER_SITE_OTHER): Payer: BC Managed Care – PPO | Admitting: Physical Therapy

## 2022-12-24 DIAGNOSIS — M546 Pain in thoracic spine: Secondary | ICD-10-CM

## 2022-12-24 DIAGNOSIS — M542 Cervicalgia: Secondary | ICD-10-CM
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
## 2022-12-25 ENCOUNTER — Ambulatory Visit: Payer: BC Managed Care – PPO | Admitting: Behavioral Health

## 2022-12-26 ENCOUNTER — Encounter: Payer: Self-pay | Admitting: Physical Therapy

## 2022-12-26 NOTE — Addendum Note (Signed)
Addended by: Sedalia Muta on: 12/26/2022 09:53 AM   Modules accepted: Orders

## 2023-01-03 ENCOUNTER — Encounter: Payer: BC Managed Care – PPO | Admitting: Physical Therapy

## 2023-01-10 ENCOUNTER — Encounter: Payer: Self-pay | Admitting: Physical Therapy

## 2023-01-10 ENCOUNTER — Ambulatory Visit (INDEPENDENT_AMBULATORY_CARE_PROVIDER_SITE_OTHER): Payer: BC Managed Care – PPO | Admitting: Physical Therapy

## 2023-01-10 DIAGNOSIS — M542 Cervicalgia: Secondary | ICD-10-CM

## 2023-01-10 DIAGNOSIS — M546 Pain in thoracic spine: Secondary | ICD-10-CM
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
## 2023-01-17 ENCOUNTER — Ambulatory Visit (INDEPENDENT_AMBULATORY_CARE_PROVIDER_SITE_OTHER): Payer: BC Managed Care – PPO | Admitting: Physical Therapy

## 2023-01-17 ENCOUNTER — Encounter: Payer: Self-pay | Admitting: Physical Therapy

## 2023-01-17 DIAGNOSIS — M542 Cervicalgia: Secondary | ICD-10-CM | POA: Diagnosis not present

## 2023-01-17 DIAGNOSIS — M546 Pain in thoracic spine: Secondary | ICD-10-CM | POA: Diagnosis not present
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
## 2023-01-23 ENCOUNTER — Other Ambulatory Visit: Payer: Self-pay | Admitting: Family Medicine

## 2023-01-24 ENCOUNTER — Ambulatory Visit (INDEPENDENT_AMBULATORY_CARE_PROVIDER_SITE_OTHER): Payer: BC Managed Care – PPO | Admitting: Physical Therapy

## 2023-01-24 ENCOUNTER — Encounter: Payer: Self-pay | Admitting: Physical Therapy

## 2023-01-24 DIAGNOSIS — M546 Pain in thoracic spine: Secondary | ICD-10-CM | POA: Diagnosis not present

## 2023-01-24 DIAGNOSIS — M542 Cervicalgia: Secondary | ICD-10-CM | POA: Diagnosis not present
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
## 2023-01-31 ENCOUNTER — Encounter: Payer: BC Managed Care – PPO | Admitting: Physical Therapy

## 2023-02-04 ENCOUNTER — Ambulatory Visit (INDEPENDENT_AMBULATORY_CARE_PROVIDER_SITE_OTHER): Payer: BC Managed Care – PPO | Admitting: Family Medicine

## 2023-02-04 ENCOUNTER — Encounter: Payer: Self-pay | Admitting: Family Medicine

## 2023-02-04 VITALS — BP 112/78 | HR 60 | Temp 98.6°F | Resp 18 | Ht 62.0 in | Wt 168.4 lb

## 2023-02-04 DIAGNOSIS — I209 Angina pectoris, unspecified: Secondary | ICD-10-CM | POA: Diagnosis not present

## 2023-02-04 DIAGNOSIS — F411 Generalized anxiety disorder: Secondary | ICD-10-CM

## 2023-02-04 DIAGNOSIS — J301 Allergic rhinitis due to pollen: Secondary | ICD-10-CM | POA: Diagnosis not present

## 2023-02-04 DIAGNOSIS — R600 Localized edema: Secondary | ICD-10-CM

## 2023-02-04 DIAGNOSIS — E7800 Pure hypercholesterolemia, unspecified: Secondary | ICD-10-CM

## 2023-02-04 MED ORDER — IPRATROPIUM BROMIDE 0.06 % NA SOLN: NASAL | 3 refills | Status: DC

## 2023-02-04 NOTE — Assessment & Plan Note (Signed)
Chronic.  Improved.  Continue Astelin nasal spray, Allegra 180 mg daily, Flonase, Atrovent nasal spray

## 2023-02-04 NOTE — Assessment & Plan Note (Signed)
Chronic.  Controlled.  Continue Lipitor 40 mg daily.  Will follow-up for annual physical

## 2023-02-04 NOTE — Assessment & Plan Note (Signed)
Chronic.  Patient not really wanting to take more medications.  Advised to do lemon water, compression stockings, elevation, continue to be active

## 2023-02-04 NOTE — Assessment & Plan Note (Signed)
Chronic.  Mostly controlled.  Continue Luvox 50 mg daily, escitalopram 20 mg daily, mirtazapine 15 mg at bedtime.  She declines counseling at this time.

## 2023-02-04 NOTE — Assessment & Plan Note (Signed)
Chronic.  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  Seeing cardiology.  Continue Imdur 30 mg daily.  Nitroglycerin as needed

## 2023-02-04 NOTE — Patient Instructions (Signed)
It was very nice to see you today!  Compression stockings  lemon water.     PLEASE NOTE:  If you had any lab tests please let us know if you have not heard back within a few days. You may see your results on MyChart before we have a chance to review them but we will give you a call once they are reviewed by [place]. If we ordered any referrals today, please let us know if you have not heard from their office within the next week.   Please try these tips to maintain a healthy lifestyle:  Eat most of your calories during the day when you are active. Eliminate processed foods including packaged sweets (pies, cakes, cookies), reduce intake of potatoes, white bread, white pasta, and white rice. Look for whole grain options, oat flour or almond flour.  Each meal should contain half fruits/vegetables, one quarter protein, and one quarter carbs (no bigger than a computer mouse).  Cut down on sweet beverages. This includes juice, soda, and sweet tea. Also watch fruit intake, though this is a healthier sweet option, it still contains natural sugar! Limit to 3 servings daily.  Drink at least 1 glass of water with each meal and aim for at least 8 glasses per day  Exercise at least 150 minutes every week.

## 2023-02-04 NOTE — Progress Notes (Signed)
Subjective:     Patient ID: Debra Martinez, female    DOB: August 13, 1961, 62 y.o.   MRN: [id number]  Chief Complaint  Patient presents with   Medical Management of Chronic Issues    6 months follow-up moods, etc     HPI  HLD-mixed.  On Lipitor 40 mg    Generalized anxiety disorder/PTSD-on luvox 50, Lexapro 20 and mirtazipine 15 mg.A lot going on-son graduating-just "trying to get through the school year".  No SI.  Tired and thirsty.   Allergic rhinitis- finally starting to ease off.  Atrovent working very well.   Pap 2 years ago.  Mamm due-she will sch.  Angina-nitroglycerin twice.  No major blockages but ?spams.  Seeing Card  Health Maintenance Due  Topic Date Due   HIV Screening  Never done   Hepatitis C Screening  Never done   DTaP/Tdap/Td (1 - Tdap) Never done   PAP SMEAR-Modifier  Never done   MAMMOGRAM  08/01/2017    Past Medical History:  Diagnosis Date   Allergy    Anxiety    Arthritis    Heart murmur    Hyperlipidemia     Past Surgical History:  Procedure Laterality Date   APPENDECTOMY     BILATERAL SALPINGECTOMY     ECTOPIC PREGNANCY SURGERY     KNEE ARTHROSCOPY Right 08/2021   TONSILLECTOMY       Current Outpatient Medications:    atorvastatin (LIPITOR) 40 MG tablet, Take 1 tablet (40 mg total) by mouth daily., Disp: 90 tablet, Rfl: 3   azelastine (ASTELIN) 0.1 % nasal spray, Place 2 sprays into both nostrils 2 (two) times daily., Disp: 30 mL, Rfl: 12   diclofenac Sodium (VOLTAREN) 1 % GEL, Apply topically., Disp: , Rfl:    DOXYLAMINE SUCCINATE PO, Take by mouth., Disp: , Rfl:    escitalopram (LEXAPRO) 20 MG tablet, Take 1 tablet (20 mg total) by mouth daily., Disp: 90 tablet, Rfl: 3   fexofenadine (ALLEGRA) 180 MG tablet, Take by mouth., Disp: , Rfl:    fluticasone (FLONASE) 50 MCG/ACT nasal spray, Place into both nostrils daily., Disp: , Rfl:    fluvoxaMINE (LUVOX) 50 MG tablet, Take 50 mg by mouth at bedtime., Disp: , Rfl:    isosorbide mononitrate  (IMDUR) 30 MG 24 hr tablet, Take by mouth., Disp: , Rfl:    Magnesium 400 MG TABS, Take by mouth., Disp: , Rfl:    methocarbamol (ROBAXIN) 500 MG tablet, TAKE 1 TABLET BY MOUTH EVERY 8 HOURS AS NEEDED FOR MUSCLE SPASM, Disp: 20 tablet, Rfl: 0   mirtazapine (REMERON) 15 MG tablet, Take 1 tablet (15 mg total) by mouth at bedtime., Disp: 90 tablet, Rfl: 3   naproxen (NAPROSYN) 500 MG tablet, Take 1 tablet (500 mg total) by mouth 2 (two) times daily with a meal., Disp: 30 tablet, Rfl: 0   nitroGLYCERIN (NITROSTAT) 0.4 MG SL tablet, Place under the tongue., Disp: , Rfl:    ipratropium (ATROVENT) 0.06 % nasal spray, USE 2 SPRAY(S) IN EACH NOSTRIL 4 TIMES DAILY, Disp: 45 mL, Rfl: 3  Allergies  Allergen Reactions   Penicillin G Anaphylaxis   Sulfa Antibiotics Other (See Comments)    Other reaction(s): Dizziness (intolerance)   ROS neg/noncontributory except as noted HPI/below Snoring-occasional Edema-mild.  No shortness of breath.     Objective:     BP 112/78   Pulse 60   Temp 98.6 F (37 C) (Temporal)   Resp 18   Ht 5\' 2"  (1.575  m)   Wt 168 lb 6 oz (76.4 kg)   SpO2 98%   BMI 30.80 kg/m  Wt Readings from Last 3 Encounters:  02/04/23 168 lb 6 oz (76.4 kg)  12/11/22 166 lb 4 oz (75.4 kg)  08/21/22 162 lb 2 oz (73.5 kg)    Physical Exam   Gen: WDWN NAD HEENT: NCAT, conjunctiva not injected, sclera nonicteric NECK:  supple, no thyromegaly, no nodes, no carotid bruits CARDIAC: RRR, S1S2+, no murmur. DP 2+B LUNGS: CTAB. No wheezes ABDOMEN:  BS+, soft, NTND, No HSM, no masses EXT:  tr edema MSK: no gross abnormalities.  NEURO: A&O x3.  CN II-XII intact.  PSYCH: normal mood. Good eye contact     Assessment & Plan:  Pure hypercholesterolemia Assessment & Plan: Chronic.  Controlled.  Continue Lipitor 40 mg daily.  Will follow-up for annual physical   Seasonal allergic rhinitis due to pollen Assessment & Plan: Chronic.  Improved.  Continue Astelin nasal spray, Allegra 180 mg  daily, Flonase, Atrovent nasal spray   GAD (generalized anxiety disorder) Assessment & Plan: Chronic.  Mostly controlled.  Continue Luvox 50 mg daily, escitalopram 20 mg daily, mirtazapine 15 mg at bedtime.  She declines counseling at this time.   Angina pectoris [facility]) Assessment & Plan: Chronic.  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  Seeing cardiology.  Continue Imdur 30 mg daily.  Nitroglycerin as needed   Localized edema Assessment & Plan: Chronic.  Patient not really wanting to take more medications.  Advised to do lemon water, compression stockings, elevation, continue to be active   Other orders -     Ipratropium Bromide; USE 2 SPRAY(S) IN EACH NOSTRIL 4 TIMES DAILY  Dispense: 45 mL; Refill: 3  Follow up Comprehensive Physical Exam (CPE) preventive care annual visit/labs soon.  Angelena Sole, MD

## 2023-02-07 ENCOUNTER — Encounter: Payer: BC Managed Care – PPO | Admitting: Physical Therapy

## 2023-02-27 ENCOUNTER — Encounter: Payer: Self-pay | Admitting: Family Medicine

## 2023-02-27 ENCOUNTER — Ambulatory Visit (INDEPENDENT_AMBULATORY_CARE_PROVIDER_SITE_OTHER): Payer: BC Managed Care – PPO | Admitting: Family Medicine

## 2023-02-27 VITALS — BP 118/60 | HR 78 | Temp 98.7°F | Resp 18 | Ht 62.0 in | Wt 167.2 lb

## 2023-02-27 DIAGNOSIS — Z1159 Encounter for screening for other viral diseases: Secondary | ICD-10-CM

## 2023-02-27 DIAGNOSIS — Z23 Encounter for immunization: Secondary | ICD-10-CM

## 2023-02-27 DIAGNOSIS — Z131 Encounter for screening for diabetes mellitus: Secondary | ICD-10-CM | POA: Diagnosis not present

## 2023-02-27 DIAGNOSIS — Z0000 Encounter for general adult medical examination without abnormal findings: Secondary | ICD-10-CM | POA: Diagnosis not present

## 2023-02-27 DIAGNOSIS — E7800 Pure hypercholesterolemia, unspecified: Secondary | ICD-10-CM | POA: Diagnosis not present

## 2023-02-27 LAB — COMPREHENSIVE METABOLIC PANEL
ALT: 24 U/L (ref 0–35)
AST: 23 U/L (ref 0–37)
Albumin: 4.6 g/dL (ref 3.5–5.2)
Alkaline Phosphatase: 61 U/L (ref 39–117)
BUN: 15 mg/dL (ref 6–23)
CO2: 27 mEq/L (ref 19–32)
Calcium: 9.6 mg/dL (ref 8.4–10.5)
Chloride: 108 mEq/L (ref 96–112)
Creatinine, Ser: 0.85 mg/dL (ref 0.40–1.20)
GFR: 72.94 mL/min (ref >60.00)
Glucose, Bld: 104 mg/dL — ABNORMAL HIGH (ref 70–99)
Potassium: 4.1 mEq/L (ref 3.5–5.1)
Sodium: 143 mEq/L (ref 135–145)
Total Bilirubin: 0.5 mg/dL (ref 0.2–1.2)
Total Protein: 6.7 g/dL (ref 6.0–8.3)

## 2023-02-27 LAB — CBC WITH DIFFERENTIAL/PLATELET
Basophils Absolute: 0 10*3/uL (ref 0.0–0.1)
Basophils Relative: 0.3 % (ref 0.0–3.0)
Eosinophils Absolute: 0 10*3/uL (ref 0.0–0.7)
Eosinophils Relative: 0.8 % (ref 0.0–5.0)
HCT: 43.7 % (ref 36.0–46.0)
Hemoglobin: 14.2 g/dL (ref 12.0–15.0)
Lymphocytes Relative: 22.1 % (ref 12.0–46.0)
Lymphs Abs: 1 10*3/uL (ref 0.7–4.0)
MCHC: 32.5 g/dL (ref 30.0–36.0)
MCV: 90.4 fl (ref 78.0–100.0)
Monocytes Absolute: 0.3 10*3/uL (ref 0.1–1.0)
Monocytes Relative: 5.9 % (ref 3.0–12.0)
Neutro Abs: 3.3 10*3/uL (ref 1.4–7.7)
Neutrophils Relative %: 70.9 % (ref 43.0–77.0)
Platelets: 149 10*3/uL — ABNORMAL LOW (ref 150.0–400.0)
RBC: 4.84 Mil/uL (ref 3.87–5.11)
RDW: 14 % (ref 11.5–15.5)
WBC: 4.7 10*3/uL (ref 4.0–10.5)

## 2023-02-27 LAB — LIPID PANEL
Cholesterol: 95 mg/dL (ref 0–200)
HDL: 40.2 mg/dL (ref >39.00)
LDL Cholesterol: 31 mg/dL (ref 0–99)
NonHDL: 54.34
Total CHOL/HDL Ratio: 2
Triglycerides: 119 mg/dL (ref 0.0–149.0)
VLDL: 23.8 mg/dL (ref 0.0–40.0)

## 2023-02-27 LAB — TSH: TSH: 1.16 u[IU]/mL (ref 0.35–5.50)

## 2023-02-27 MED ORDER — ISOSORBIDE MONONITRATE ER 30 MG PO TB24: 30.0000 mg | ORAL_TABLET | Freq: Every day | ORAL | 1 refills | Status: DC

## 2023-02-27 NOTE — Progress Notes (Signed)
Phone [phone]   Subjective:   Patient is a 62 y.o. female presenting for annual physical.    Chief Complaint  Patient presents with   Annual Exam    CPE Had coffee with milk     Annual-WW-no change in weight. For 2 weeks. exercising Son graduated. On spectrum Moods-will go to counseling  See problem oriented charting- ROS- ROS: Gen: no fever, chills  Skin: no rash, itching ENT: no ear pain, ear drainage, nasal congestion, rhinorrhea, sinus pressure, sore throat Eyes: no blurry vision, double vision Resp: no cough, wheeze,SOB CV: no CP, palpitations, .  Wearing compression stockings for edema. GI: no heartburn, n/v/d/c, abd pain GU: no dysuria, urgency, frequency, hematuria MSK: no joint pain, myalgias, back pain Neuro: no dizziness, headache, weakness, vertigo Psych:chronic  The following were reviewed and entered/updated in epic: Past Medical History:  Diagnosis Date   Allergy    Anxiety    Arthritis    Heart murmur    Hyperlipidemia    Patient Active Problem List   Diagnosis Date Noted   Seasonal allergic rhinitis due to pollen 02/04/2023   Pure hypercholesterolemia 07/05/2022   GAD (generalized anxiety disorder) 07/05/2022   Primary insomnia 07/05/2022   Localized edema 07/05/2022   Angina pectoris (HCC) 07/05/2022   Past Surgical History:  Procedure Laterality Date   APPENDECTOMY     BILATERAL SALPINGECTOMY     ECTOPIC PREGNANCY SURGERY     KNEE ARTHROSCOPY Right 08/2021   TONSILLECTOMY      Family History  Problem Relation Age of Onset   Drug abuse Father    Alcohol abuse Father    Heart disease Maternal Grandmother    Cancer Maternal Grandmother    Heart disease Maternal Grandfather    Hyperlipidemia Paternal Grandfather    Heart disease Paternal Grandfather     Medications- reviewed and updated Current Outpatient Medications  Medication Sig Dispense Refill   atorvastatin (LIPITOR) 40 MG tablet Take 1 tablet (40 mg total) by mouth  daily. 90 tablet 3   azelastine (ASTELIN) 0.1 % nasal spray Place 2 sprays into both nostrils 2 (two) times daily. 30 mL 12   diclofenac Sodium (VOLTAREN) 1 % GEL Apply topically.     DOXYLAMINE SUCCINATE PO Take by mouth.     escitalopram (LEXAPRO) 20 MG tablet Take 1 tablet (20 mg total) by mouth daily. 90 tablet 3   fexofenadine (ALLEGRA) 180 MG tablet Take by mouth.     fluticasone (FLONASE) 50 MCG/ACT nasal spray Place into both nostrils daily.     fluvoxaMINE (LUVOX) 50 MG tablet Take 50 mg by mouth at bedtime.     ipratropium (ATROVENT) 0.06 % nasal spray USE 2 SPRAY(S) IN EACH NOSTRIL 4 TIMES DAILY 45 mL 3   Magnesium 400 MG TABS Take by mouth.     methocarbamol (ROBAXIN) 500 MG tablet TAKE 1 TABLET BY MOUTH EVERY 8 HOURS AS NEEDED FOR MUSCLE SPASM 20 tablet 0   mirtazapine (REMERON) 15 MG tablet Take 1 tablet (15 mg total) by mouth at bedtime. 90 tablet 3   naproxen (NAPROSYN) 500 MG tablet Take 1 tablet (500 mg total) by mouth 2 (two) times daily with a meal. 30 tablet 0   nitroGLYCERIN (NITROSTAT) 0.4 MG SL tablet Place under the tongue.     isosorbide mononitrate (IMDUR) 30 MG 24 hr tablet Take 1 tablet (30 mg total) by mouth daily. 90 tablet 1   No current facility-administered medications for this visit.    Allergies-reviewed  and updated Allergies  Allergen Reactions   Penicillin G Anaphylaxis   Sulfa Antibiotics Other (See Comments)    Other reaction(s): Dizziness (intolerance)    Social History   Social History Narrative   1 biological,  2 adopted.    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   Objective  Objective:  BP 118/60   Pulse 78   Temp 98.7 F (37.1 C) (Temporal)   Resp 18   Ht 5\' 2"  (1.575 m)   Wt 167 lb 4 oz (75.9 kg)   SpO2 96%   BMI 30.59 kg/m  Physical Exam  Gen: WDWN NAD HEENT: NCAT, conjunctiva not injected, sclera nonicteric TM WNL B, OP moist, no exudates  NECK:  supple, no thyromegaly, no nodes, no carotid bruits CARDIAC: RRR, S1S2+,  no murmur. DP 2+B LUNGS: CTAB. No wheezes ABDOMEN:  BS+, soft, NTND, No HSM, no masses EXT:  no edema MSK: no gross abnormalities. MS 5/5 all 4 NEURO: A&O x3.  CN II-XII intact.  PSYCH: normal mood. Good eye contact     Assessment and Plan   Health Maintenance counseling: 1. Anticipatory guidance: Patient counseled regarding regular dental exams q6 months, eye exams,  avoiding smoking and second hand smoke, limiting alcohol to 1 beverage per day, no illicit drugs.   2. Risk factor reduction:  Advised patient of need for regular exercise and diet rich and fruits and vegetables to reduce risk of heart attack and stroke. Exercise- +.  Wt Readings from Last 3 Encounters:  02/27/23 167 lb 4 oz (75.9 kg)  02/04/23 168 lb 6 oz (76.4 kg)  12/11/22 166 lb 4 oz (75.4 kg)   3. Immunizations/screenings/ancillary studies Immunization History  Administered Date(s) Administered   Influenza, Quadrivalent, Recombinant, Inj, Pf 06/16/2020, 06/19/2021   Influenza,inj,Quad PF,6+ Mos 06/16/2020, 06/19/2021   Influenza-Unspecified 06/12/2019   Moderna Sars-Covid-2 Vaccination 11/26/2019, 12/24/2019   PFIZER(Purple Top)SARS-COV-2 Vaccination 07/25/2020   Tdap 02/27/2023   Zoster Recombinat (Shingrix) 02/26/2019   Zoster, Unspecified 06/12/2019   Health Maintenance Due  Topic Date Due   HIV Screening  Never done   Hepatitis C Screening  Never done   MAMMOGRAM  08/01/2017    4. Cervical cancer screening- per patient, done 5. Breast cancer screening-  mammogram patient will sch 6. Colon cancer screening - utd 7. Skin cancer screening- advised regular sunscreen use. Denies worrisome, changing, or new skin lesions.  8. Birth control/STD check- n/a 9. Osteoporosis screening- n/a 10. Smoking associated screening - non smoker  Wellness examination -     CBC with Differential/Platelet -     Comprehensive metabolic panel -     Lipid panel -     TSH -     Hemoglobin A1c  Pure  hypercholesterolemia -     Lipid panel  Need for tetanus booster -     Tdap vaccine greater than or equal to 7yo IM  Screening for viral disease -     Hepatitis C antibody -     HIV Antibody (routine testing w rflx)  Other orders -     Isosorbide Mononitrate ER; Take 1 tablet (30 mg total) by mouth daily.  Dispense: 90 tablet; Refill: 1   Wellness-anticipatory guidance.  Work on Diet/Exercise  Check CBC,CMP,lipids,TSH, A1C.  F/u 1 yr  Tetanus, Diphtheria, and Pertussis (Tdap) given.  Patient will schedule mamm.   HLD-chronic. Controlled on Lipitor 40 mg.  Check lipids  Recommended follow up: Return in about 5 months (around 07/30/2023) for chronic follow-up.  Lab/Order associations:+  fasting  Angelena Sole, MD

## 2023-02-27 NOTE — Patient Instructions (Signed)

## 2023-02-28 LAB — HEMOGLOBIN A1C
Hgb A1c MFr Bld: 5.7 % of total Hgb — ABNORMAL HIGH (ref <5.7)
Mean Plasma Glucose: 117 mg/dL
eAG (mmol/L): 6.5 mmol/L

## 2023-02-28 LAB — HEPATITIS C ANTIBODY: Hepatitis C Ab: NONREACTIVE

## 2023-02-28 LAB — HIV ANTIBODY (ROUTINE TESTING W REFLEX): HIV 1&2 Ab, 4th Generation: NONREACTIVE

## 2023-02-28 NOTE — Progress Notes (Signed)
Labs mostly great!  I think the diet is helping the cholesterol as well.   A1C(3 month average of sugars) is elevated.  This is considered PreDiabetes.  Work on diet-decrease sugars and starches and aim for 30 minutes of exercise 5 days/week to prevent progression to diabetes

## 2023-06-23 ENCOUNTER — Other Ambulatory Visit: Payer: Self-pay | Admitting: Family Medicine

## 2023-07-21 ENCOUNTER — Other Ambulatory Visit: Payer: Self-pay | Admitting: Family Medicine

## 2023-07-30 ENCOUNTER — Ambulatory Visit: Payer: BC Managed Care – PPO | Admitting: Family Medicine

## 2023-07-30 ENCOUNTER — Encounter: Payer: Self-pay | Admitting: Family Medicine

## 2023-07-30 VITALS — BP 116/70 | HR 60 | Temp 98.1°F | Resp 16 | Ht 62.0 in | Wt 148.1 lb

## 2023-07-30 DIAGNOSIS — Z23 Encounter for immunization: Secondary | ICD-10-CM | POA: Diagnosis not present

## 2023-07-30 DIAGNOSIS — F411 Generalized anxiety disorder: Secondary | ICD-10-CM

## 2023-07-30 DIAGNOSIS — E559 Vitamin D deficiency, unspecified: Secondary | ICD-10-CM | POA: Insufficient documentation

## 2023-07-30 DIAGNOSIS — J301 Allergic rhinitis due to pollen: Secondary | ICD-10-CM | POA: Diagnosis not present

## 2023-07-30 DIAGNOSIS — M79641 Pain in right hand: Secondary | ICD-10-CM | POA: Diagnosis not present

## 2023-07-30 DIAGNOSIS — I209 Angina pectoris, unspecified: Secondary | ICD-10-CM | POA: Diagnosis not present

## 2023-07-30 DIAGNOSIS — M79642 Pain in left hand: Secondary | ICD-10-CM | POA: Diagnosis not present

## 2023-07-30 DIAGNOSIS — E7800 Pure hypercholesterolemia, unspecified: Secondary | ICD-10-CM | POA: Diagnosis not present

## 2023-07-30 DIAGNOSIS — F5101 Primary insomnia: Secondary | ICD-10-CM

## 2023-07-30 DIAGNOSIS — R7303 Prediabetes: Secondary | ICD-10-CM | POA: Insufficient documentation

## 2023-07-30 DIAGNOSIS — R600 Localized edema: Secondary | ICD-10-CM

## 2023-07-30 LAB — CBC WITH DIFFERENTIAL/PLATELET
Basophils Absolute: 0 10*3/uL (ref 0.0–0.1)
Basophils Relative: 0.4 % (ref 0.0–3.0)
Eosinophils Absolute: 0 10*3/uL (ref 0.0–0.7)
Eosinophils Relative: 1.1 % (ref 0.0–5.0)
HCT: 43 % (ref 36.0–46.0)
Hemoglobin: 14.4 g/dL (ref 12.0–15.0)
Lymphocytes Relative: 26.3 % (ref 12.0–46.0)
Lymphs Abs: 1.1 10*3/uL (ref 0.7–4.0)
MCHC: 33.5 g/dL (ref 30.0–36.0)
MCV: 92 fL (ref 78.0–100.0)
Monocytes Absolute: 0.2 10*3/uL (ref 0.1–1.0)
Monocytes Relative: 5.2 % (ref 3.0–12.0)
Neutro Abs: 2.9 10*3/uL (ref 1.4–7.7)
Neutrophils Relative %: 67 % (ref 43.0–77.0)
Platelets: 136 10*3/uL — ABNORMAL LOW (ref 150.0–400.0)
RBC: 4.67 Mil/uL (ref 3.87–5.11)
RDW: 13.9 % (ref 11.5–15.5)
WBC: 4.3 10*3/uL (ref 4.0–10.5)

## 2023-07-30 LAB — LIPID PANEL
Cholesterol: 102 mg/dL (ref 0–200)
HDL: 38.2 mg/dL — ABNORMAL LOW (ref >39.00)
LDL Cholesterol: 38 mg/dL (ref 0–99)
NonHDL: 63.89
Total CHOL/HDL Ratio: 3
Triglycerides: 130 mg/dL (ref 0.0–149.0)
VLDL: 26 mg/dL (ref 0.0–40.0)

## 2023-07-30 LAB — HEMOGLOBIN A1C: Hgb A1c MFr Bld: 5.9 % (ref 4.6–6.5)

## 2023-07-30 LAB — COMPREHENSIVE METABOLIC PANEL
ALT: 21 U/L (ref 0–35)
AST: 21 U/L (ref 0–37)
Albumin: 4.5 g/dL (ref 3.5–5.2)
Alkaline Phosphatase: 55 U/L (ref 39–117)
BUN: 15 mg/dL (ref 6–23)
CO2: 28 meq/L (ref 19–32)
Calcium: 9.5 mg/dL (ref 8.4–10.5)
Chloride: 106 meq/L (ref 96–112)
Creatinine, Ser: 0.75 mg/dL (ref 0.40–1.20)
GFR: 84.52 mL/min (ref >60.00)
Glucose, Bld: 87 mg/dL (ref 70–99)
Potassium: 3.5 meq/L (ref 3.5–5.1)
Sodium: 143 meq/L (ref 135–145)
Total Bilirubin: 0.6 mg/dL (ref 0.2–1.2)
Total Protein: 6.6 g/dL (ref 6.0–8.3)

## 2023-07-30 LAB — VITAMIN D 25 HYDROXY (VIT D DEFICIENCY, FRACTURES): VITD: 66.84 ng/mL (ref 30.00–100.00)

## 2023-07-30 LAB — SEDIMENTATION RATE: Sed Rate: 4 mm/h (ref 0–30)

## 2023-07-30 LAB — TSH: TSH: 1.63 u[IU]/mL (ref 0.35–5.50)

## 2023-07-30 MED ORDER — IPRATROPIUM BROMIDE 0.06 % NA SOLN: NASAL | 3 refills | Status: DC

## 2023-07-30 MED ORDER — FEXOFENADINE HCL 180 MG PO TABS: 180.0000 mg | ORAL_TABLET | Freq: Every day | ORAL | 3 refills | Status: AC | PRN

## 2023-07-30 MED ORDER — ISOSORBIDE MONONITRATE ER 30 MG PO TB24: 30.0000 mg | ORAL_TABLET | Freq: Every day | ORAL | 1 refills | Status: DC

## 2023-07-30 MED ORDER — AZELASTINE HCL 0.1 % NA SOLN: 2.0000 | Freq: Two times a day (BID) | NASAL | 12 refills | Status: DC

## 2023-07-30 MED ORDER — MIRTAZAPINE 15 MG PO TABS: 15.0000 mg | ORAL_TABLET | Freq: Every day | ORAL | 3 refills | Status: DC

## 2023-07-30 MED ORDER — FLUVOXAMINE MALEATE 50 MG PO TABS: 50.0000 mg | ORAL_TABLET | Freq: Every day | ORAL | 1 refills | Status: DC

## 2023-07-30 MED ORDER — ATORVASTATIN CALCIUM 40 MG PO TABS: 40.0000 mg | ORAL_TABLET | Freq: Every day | ORAL | 3 refills | Status: DC

## 2023-07-30 NOTE — Assessment & Plan Note (Addendum)
Chronic.  Patient not really wanting to take more medications. Doing better w/compression stockings

## 2023-07-30 NOTE — Assessment & Plan Note (Addendum)
Chronic.  Mostly controlled.  Continue Luvox 50 mg daily, mirtazapine 15 mg at bedtime.  She declines counseling and Buspar

## 2023-07-30 NOTE — Assessment & Plan Note (Signed)
Chronic.  Controlled.  Continue Lipitor 40 mg daily

## 2023-07-30 NOTE — Assessment & Plan Note (Signed)
Chronic.  No meds.  Check levels

## 2023-07-30 NOTE — Progress Notes (Signed)
Subjective:     Patient ID: Debra Martinez, female    DOB: March 30, 1961, 62 y.o.   MRN: [id number]  Chief Complaint  Patient presents with   Medical Management of Chronic Issues    5 month follow-up Fasting Arthritis in hands, hands really stiff, painful to hold anything    HPI  GAD/PTSD - She is taking Luvox 50 mg and Mirtazapine 15 mg at bedtime. Has not been taking Lexapro. Reports increased anxiety. She is also worried about the political climate following the election. Her son has graduated and now attends [employer]. No SI. She notes she has not previously tried Buspar to manage her moods.   PreDM - She is currently participating in Weight Watchers and has lost about 20 lbs. Exercising regularly and maintaining a healthy diet. Has cut back on bread and sugars. Eating less red meat. Also only drinks a glass of wine about once a week. States her goal weight is 130-135 lbs. States her weight has reached a plateau at 147 lbs at home.   Arthritis - She complains of arthritis in hands has progressed. States it is now difficult to even hold a pen. Tries to do knitting to strengthen her fingers.   HLD - Pt taking Atorvastatin 40 mg once daily.   Allergies - Using Allegra 180 mg . Not using Flonase. States her allergies tend to flare up around this time of the year. Needs refills  Angina - Has only needed to take Nitroglycerin about 3 times in the last 6 months. Has not seen cardiology in about over a year.  Edema-Has started wearing compression socks and uses sleeves during warmer weather.   Vitamin D Deficiency  - She reports a previous hx of vitamin D deficiency. Notes she has not been going outside as often recently. Endorses low energy and recently started taking multi-vitamins last week. Not currently taking vitamin D supplementation.  Has not been taking naproxen for neck/back pain.  Received Covid vaccine in July. Plans to travel abroad soon.    There are no preventive care reminders  to display for this patient.   Past Medical History:  Diagnosis Date   Allergy    Anxiety    Arthritis    Heart murmur    Hyperlipidemia     Past Surgical History:  Procedure Laterality Date   APPENDECTOMY     BILATERAL SALPINGECTOMY     ECTOPIC PREGNANCY SURGERY     KNEE ARTHROSCOPY Right 08/2021   TONSILLECTOMY       Current Outpatient Medications:    diclofenac Sodium (VOLTAREN) 1 % GEL, Apply topically., Disp: , Rfl:    DOXYLAMINE SUCCINATE PO, Take by mouth., Disp: , Rfl:    Magnesium 400 MG TABS, Take by mouth., Disp: , Rfl:    methocarbamol (ROBAXIN) 500 MG tablet, TAKE 1 TABLET BY MOUTH EVERY 8 HOURS AS NEEDED FOR MUSCLE SPASM, Disp: 20 tablet, Rfl: 0   atorvastatin (LIPITOR) 40 MG tablet, Take 1 tablet (40 mg total) by mouth daily., Disp: 90 tablet, Rfl: 3   azelastine (ASTELIN) 0.1 % nasal spray, Place 2 sprays into both nostrils 2 (two) times daily., Disp: 30 mL, Rfl: 12   fexofenadine (ALLEGRA) 180 MG tablet, Take 1 tablet (180 mg total) by mouth daily as needed for allergies or rhinitis., Disp: 90 tablet, Rfl: 3   fluvoxaMINE (LUVOX) 50 MG tablet, Take 1 tablet (50 mg total) by mouth at bedtime., Disp: 90 tablet, Rfl: 1   ipratropium (ATROVENT) 0.06 %  nasal spray, USE 2 SPRAY(S) IN EACH NOSTRIL 4 TIMES DAILY, Disp: 45 mL, Rfl: 3   isosorbide mononitrate (IMDUR) 30 MG 24 hr tablet, Take 1 tablet (30 mg total) by mouth daily., Disp: 90 tablet, Rfl: 1   mirtazapine (REMERON) 15 MG tablet, Take 1 tablet (15 mg total) by mouth at bedtime., Disp: 90 tablet, Rfl: 3   nitroGLYCERIN (NITROSTAT) 0.4 MG SL tablet, Place under the tongue., Disp: , Rfl:   Allergies  Allergen Reactions   Penicillin G Anaphylaxis   Sulfa Antibiotics Other (See Comments)    Other reaction(s): Dizziness (intolerance)   ROS neg/noncontributory except as noted HPI/below      Objective:     BP 116/70   Pulse 60   Temp 98.1 F (36.7 C) (Temporal)   Resp 16   Ht 5\' 2"  (1.575 m)   Wt 148  lb 2 oz (67.2 kg)   SpO2 99%   BMI 27.09 kg/m  Wt Readings from Last 3 Encounters:  07/30/23 148 lb 2 oz (67.2 kg)  02/27/23 167 lb 4 oz (75.9 kg)  02/04/23 168 lb 6 oz (76.4 kg)    Physical Exam   Gen: WDWN NAD HEENT: NCAT, conjunctiva not injected, sclera nonicteric NECK:  supple, no thyromegaly, no nodes, no carotid bruits CARDIAC: RRR, S1S2+, no murmur. DP 2+B LUNGS: CTAB. No wheezes ABDOMEN:  BS+, soft, NTND, No HSM, no masses EXT:  tr edema.  Wearing compression stockings MSK: no gross abnormalities.  NEURO: A&O x3.  CN II-XII intact.  PSYCH: normal mood. Good eye contact  +Pos hand squeeze test, bilaterally +Pos thumb grind test +Swelling over DIP, PIP, and wrists.     Assessment & Plan:  Pure hypercholesterolemia Assessment & Plan: Chronic.  Controlled.  Continue Lipitor 40 mg daily.    Orders: -     Comprehensive metabolic panel -     Lipid panel -     Atorvastatin Calcium; Take 1 tablet (40 mg total) by mouth daily.  Dispense: 90 tablet; Refill: 3  Angina pectoris (HCC) Assessment & Plan: Chronic.  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  Was seeing [facility] cardiology but requesting xfer to [facility].  Continue Imdur 30 mg daily.  Nitroglycerin as needed  Orders: -     Ambulatory referral to Cardiology -     TSH -     Comprehensive metabolic panel -     CBC with Differential/Platelet -     Isosorbide Mononitrate ER; Take 1 tablet (30 mg total) by mouth daily.  Dispense: 90 tablet; Refill: 1  Seasonal allergic rhinitis due to pollen Assessment & Plan: Chronic.  Improved.  Continue Astelin nasal spray, Allegra 180 mg daily, Flonase, Atrovent nasal spray  Orders: -     Azelastine HCl; Place 2 sprays into both nostrils 2 (two) times daily.  Dispense: 30 mL; Refill: 12 -     Ipratropium Bromide; USE 2 SPRAY(S) IN EACH NOSTRIL 4 TIMES DAILY  Dispense: 45 mL; Refill: 3 -     Fexofenadine HCl; Take 1 tablet (180 mg total) by mouth  daily as needed for allergies or rhinitis.  Dispense: 90 tablet; Refill: 3  Prediabetes Assessment & Plan: Newer dx.  Working on diet.  Doing [employer].   Orders: -     Hemoglobin A1c  GAD (generalized anxiety disorder) Assessment & Plan: Chronic.  Mostly controlled.  Continue Luvox 50 mg daily, mirtazapine 15 mg at bedtime.  She declines counseling and  Buspar   Orders: -     fluvoxaMINE Maleate; Take 1 tablet (50 mg total) by mouth at bedtime.  Dispense: 90 tablet; Refill: 1  Primary insomnia Assessment & Plan: Chronic.  Controlled.  Continue mirtazipine 15mg  at hs  Orders: -     Mirtazapine; Take 1 tablet (15 mg total) by mouth at bedtime.  Dispense: 90 tablet; Refill: 3  Localized edema Assessment & Plan: Chronic.  Patient not really wanting to take more medications. Doing better w/compression stockings   Need for influenza vaccination -     Flu vaccine trivalent PF, 6mos and older(Flulaval,Afluria,Fluarix,Fluzone)  Pain in both hands -     Rheumatoid factor -     Sedimentation rate  Vitamin D deficiency Assessment & Plan: Chronic.  No meds.  Check levels  Orders: -     VITAMIN D 25 Hydroxy (Vit-D Deficiency, Fractures)    Return in about 6 months (around 01/27/2024) for chronic follow-up.   I, Isabelle Course, acting as a scribe for Angelena Sole, MD., have documented all relevant documentation on the behalf of Angelena Sole, MD, as directed by  Angelena Sole, MD while in the presence of Angelena Sole, MD.  I, Angelena Sole, MD, have reviewed all documentation for this visit. The documentation on 07/30/23 for the exam, diagnosis, procedures, and orders are all accurate and complete.  Angelena Sole, MD

## 2023-07-30 NOTE — Assessment & Plan Note (Signed)
Newer dx.  Working on diet.  Doing [employer].

## 2023-07-30 NOTE — Assessment & Plan Note (Signed)
Chronic.  Controlled.  Continue mirtazipine 15mg  at hs

## 2023-07-30 NOTE — Assessment & Plan Note (Addendum)
Chronic.  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  Was seeing [facility] cardiology but requesting xfer to [facility].  Continue Imdur 30 mg daily.  Nitroglycerin as needed

## 2023-07-30 NOTE — Patient Instructions (Addendum)
It was very nice to see you today!  Arthritis-tumeric, ginger,tylenol  Voltaren gel.  Ibuprofen, etc.   [facility]  [address] on the 1st floor Phone number [phone]    PLEASE NOTE:  If you had any lab tests please let us know if you have not heard back within a few days. You may see your results on MyChart before we have a chance to review them but we will give you a call once they are reviewed by [place]. If we ordered any referrals today, please let us know if you have not heard from their office within the next week.   Please try these tips to maintain a healthy lifestyle:  Eat most of your calories during the day when you are active. Eliminate processed foods including packaged sweets (pies, cakes, cookies), reduce intake of potatoes, white bread, white pasta, and white rice. Look for whole grain options, oat flour or almond flour.  Each meal should contain half fruits/vegetables, one quarter protein, and one quarter carbs (no bigger than a computer mouse).  Cut down on sweet beverages. This includes juice, soda, and sweet tea. Also watch fruit intake, though this is a healthier sweet option, it still contains natural sugar! Limit to 3 servings daily.  Drink at least 1 glass of water with each meal and aim for at least 8 glasses per day  Exercise at least 150 minutes every week.

## 2023-07-30 NOTE — Assessment & Plan Note (Signed)
Chronic.  Improved.  Continue Astelin nasal spray, Allegra 180 mg daily, Flonase, Atrovent nasal spray

## 2023-07-31 ENCOUNTER — Encounter: Payer: Self-pay | Admitting: Family Medicine

## 2023-08-01 LAB — RHEUMATOID FACTOR: Rheumatoid fact SerPl-aCnc: <10 [IU]/mL (ref <14)

## 2023-08-01 NOTE — Progress Notes (Signed)
Labs are stable.   1.  Monitoring platelets-if excessive bleeding/bruising, let us know 2.  Prediabetes-continue working on diet/exercise 3.  Rheumatoid negative.  Does she want to see an orthopedic doctor about her hands?

## 2023-10-01 ENCOUNTER — Ambulatory Visit: Payer: BC Managed Care – PPO | Attending: Internal Medicine | Admitting: Internal Medicine

## 2023-10-01 ENCOUNTER — Encounter: Payer: Self-pay | Admitting: Internal Medicine

## 2023-10-01 VITALS — BP 122/76 | HR 61 | Ht 62.0 in | Wt 150.6 lb

## 2023-10-01 DIAGNOSIS — Z136 Encounter for screening for cardiovascular disorders: Secondary | ICD-10-CM

## 2023-10-01 NOTE — Progress Notes (Signed)
  Cardiology Office Note:  .   Date:  10/01/2023  ID:  Debra Martinez, DOB 05/10/1961, MRN [id number] PCP: Wendolyn Jenkins Jansky, MD  [facility] Providers Cardiologist:  None    History of Present Illness: Debra Martinez   Debra Martinez is a 63 y.o. female with history of prediabetes, PTSD, arthritis, chronic chest pain thought possibly due to vasospasm who is being referred to cardiology for further evaluation.  She was originally seen at [facility] and thought to have coronary vasospasm.  She was prescribed Imdur .  She had a cath on 05/25/2022 which did not show obstructive CAD. She had an echocardiogram showing a normal LV/RV function, no valve dx; 06/20/2022 She is here for follow-up. The Imdur  is working well for her. She notes  some mild pain 2-3/10 and frequently. She does not need to use her nitroglycerin  frequently.  With one flight of stairs, she gets winded. She does the elliptical. She lost 20 pounds. No PND or orthopnea. She notes she has leg swelling.  She was recommended to wear compression stockings. Her grandmother had heart dx. Her mother had a mitraclip. Has heart failure. She is from [place].  ROS:  per HPI otherwise negative   Studies Reviewed: Debra Martinez        EKG Interpretation Date/Time:  Tuesday October 01 2023 13:48:04 EST Ventricular Rate:  61 PR Interval:  178 QRS Duration:  76 QT Interval:  422 QTC Calculation: 424 R Axis:   22  Text Interpretation: Normal sinus rhythm Low voltage QRS No previous ECGs available Confirmed by Alvan Shuck ([id number]) on 10/01/2023 1:54:53 PM  Risk Assessment/Calculations:    Physical Exam:   VS:   Vitals:   10/01/23 1342  BP: 122/76  Pulse: 61  SpO2: 98%    Wt Readings from Last 3 Encounters:  07/30/23 148 lb 2 oz (67.2 kg)  02/27/23 167 lb 4 oz (75.9 kg)  02/04/23 168 lb 6 oz (76.4 kg)    GEN: Well nourished, well developed in no acute distress NECK: No JVD; No carotid bruits CARDIAC: RRR, no murmurs, rubs, gallops RESPIRATORY:  Clear to  auscultation without rales, wheezing or rhonchi  ABDOMEN: Soft, non-tender, non-distended EXTREMITIES:  No edema; No deformity   ASSESSMENT AND PLAN: .   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  She had a cath in 05/2022 at [facility] that did not show any obstructive CAD.  She had a normal stress echo in October 2023. Her echo was unremarkable with an EF greater than 55%. Can consider a PET if symptoms recur for assessment of possible microvascular dx. otherwise she is euvolemic and may have some deconditioning.  But she is overall doing well. -Continue Imdur        Dispo: Follow-up with an APP in 6 months  Signed, Adelyna Brockman, Shuck BRAVO, MD

## 2023-10-01 NOTE — Patient Instructions (Addendum)
 Medication Instructions:  Your physician recommends that you continue on your current medications as directed. Please refer to the Current Medication list given to you today.  *If you need a refill on your cardiac medications before your next appointment, please call your pharmacy*   Lab Work: NONE ordered at this time of appointment   Testing/Procedures: NONE ordered at this time of appointment   Follow-Up: At [facility], you and your health needs are our priority.  As part of our continuing mission to provide you with exceptional heart care, we have created designated Provider Care Teams.  These Care Teams include your primary Cardiologist (physician) and Advanced Practice Providers (APPs -  Physician Assistants and Nurse Practitioners) who all work together to provide you with the care you need, when you need it.  We recommend signing up for the patient portal called MyChart.  Sign up information is provided on this After Visit Summary.  MyChart is used to connect with patients for Virtual Visits (Telemedicine).  Patients are able to view lab/test results, encounter notes, upcoming appointments, etc.  Non-urgent messages can be sent to your provider as well.   To learn more about what you can do with MyChart, go to [email or web address].    Your next appointment:   6 month(s)  Provider:   Any app     Other Instructions

## 2024-01-28 ENCOUNTER — Encounter: Payer: Self-pay | Admitting: Family Medicine

## 2024-01-28 ENCOUNTER — Ambulatory Visit: Payer: Self-pay | Admitting: Family Medicine

## 2024-01-28 ENCOUNTER — Ambulatory Visit: Payer: BC Managed Care – PPO | Admitting: Family Medicine

## 2024-01-28 VITALS — BP 114/65 | HR 60 | Temp 97.5°F | Resp 16 | Ht 62.0 in | Wt 146.2 lb

## 2024-01-28 DIAGNOSIS — R7303 Prediabetes: Secondary | ICD-10-CM

## 2024-01-28 DIAGNOSIS — M19041 Primary osteoarthritis, right hand: Secondary | ICD-10-CM | POA: Diagnosis not present

## 2024-01-28 DIAGNOSIS — F411 Generalized anxiety disorder: Secondary | ICD-10-CM

## 2024-01-28 DIAGNOSIS — I209 Angina pectoris, unspecified: Secondary | ICD-10-CM | POA: Diagnosis not present

## 2024-01-28 DIAGNOSIS — E7800 Pure hypercholesterolemia, unspecified: Secondary | ICD-10-CM

## 2024-01-28 DIAGNOSIS — M19042 Primary osteoarthritis, left hand: Secondary | ICD-10-CM

## 2024-01-28 LAB — COMPREHENSIVE METABOLIC PANEL WITH GFR
ALT: 21 U/L (ref 0–35)
AST: 19 U/L (ref 0–37)
Albumin: 4.6 g/dL (ref 3.5–5.2)
Alkaline Phosphatase: 57 U/L (ref 39–117)
BUN: 17 mg/dL (ref 6–23)
CO2: 30 meq/L (ref 19–32)
Calcium: 9.7 mg/dL (ref 8.4–10.5)
Chloride: 105 meq/L (ref 96–112)
Creatinine, Ser: 0.84 mg/dL (ref 0.40–1.20)
GFR: 73.51 mL/min (ref >60.00)
Glucose, Bld: 92 mg/dL (ref 70–99)
Potassium: 3.8 meq/L (ref 3.5–5.1)
Sodium: 142 meq/L (ref 135–145)
Total Bilirubin: 0.6 mg/dL (ref 0.2–1.2)
Total Protein: 6.7 g/dL (ref 6.0–8.3)

## 2024-01-28 LAB — CBC WITH DIFFERENTIAL/PLATELET
Basophils Absolute: 0 10*3/uL (ref 0.0–0.1)
Basophils Relative: 0.3 % (ref 0.0–3.0)
Eosinophils Absolute: 0.1 10*3/uL (ref 0.0–0.7)
Eosinophils Relative: 1.3 % (ref 0.0–5.0)
HCT: 44.8 % (ref 36.0–46.0)
Hemoglobin: 14.8 g/dL (ref 12.0–15.0)
Lymphocytes Relative: 27.5 % (ref 12.0–46.0)
Lymphs Abs: 1.3 10*3/uL (ref 0.7–4.0)
MCHC: 33 g/dL (ref 30.0–36.0)
MCV: 92.3 fl (ref 78.0–100.0)
Monocytes Absolute: 0.2 10*3/uL (ref 0.1–1.0)
Monocytes Relative: 5.4 % (ref 3.0–12.0)
Neutro Abs: 3 10*3/uL (ref 1.4–7.7)
Neutrophils Relative %: 65.5 % (ref 43.0–77.0)
Platelets: 137 10*3/uL — ABNORMAL LOW (ref 150.0–400.0)
RBC: 4.85 Mil/uL (ref 3.87–5.11)
RDW: 13.7 % (ref 11.5–15.5)
WBC: 4.5 10*3/uL (ref 4.0–10.5)

## 2024-01-28 LAB — HEMOGLOBIN A1C: Hgb A1c MFr Bld: 5.8 % (ref 4.6–6.5)

## 2024-01-28 MED ORDER — NITROGLYCERIN 0.4 MG SL SUBL: 0.4000 mg | SUBLINGUAL_TABLET | SUBLINGUAL | 1 refills | Status: DC | PRN

## 2024-01-28 MED ORDER — FLUVOXAMINE MALEATE 50 MG PO TABS: 50.0000 mg | ORAL_TABLET | Freq: Every day | ORAL | 1 refills | Status: DC

## 2024-01-28 MED ORDER — ISOSORBIDE MONONITRATE ER 30 MG PO TB24: 30.0000 mg | ORAL_TABLET | Freq: Every day | ORAL | 1 refills | Status: DC

## 2024-01-28 NOTE — Patient Instructions (Addendum)
It was very nice to see you today!  [phone] for mammogram,      PLEASE NOTE:  If you had any lab tests please let us know if you have not heard back within a few days. You may see your results on MyChart before we have a chance to review them but we will give you a call once they are reviewed by [place]. If we ordered any referrals today, please let us know if you have not heard from their office within the next week.   Please try these tips to maintain a healthy lifestyle:  Eat most of your calories during the day when you are active. Eliminate processed foods including packaged sweets (pies, cakes, cookies), reduce intake of potatoes, white bread, white pasta, and white rice. Look for whole grain options, oat flour or almond flour.  Each meal should contain half fruits/vegetables, one quarter protein, and one quarter carbs (no bigger than a computer mouse).  Cut down on sweet beverages. This includes juice, soda, and sweet tea. Also watch fruit intake, though this is a healthier sweet option, it still contains natural sugar! Limit to 3 servings daily.  Drink at least 1 glass of water with each meal and aim for at least 8 glasses per day  Exercise at least 150 minutes every week.

## 2024-01-28 NOTE — Progress Notes (Signed)
 Subjective:     Patient ID: Debra Martinez, female    DOB: 01/09/1961, 63 y.o.   MRN: [id number]  Chief Complaint  Patient presents with   Medical Management of Chronic Issues    6 month follow-up Fasting      HPI Gad predm, hld, edema, d, oa hands Pt exercises regularly.  Some doe w/stairs.  Saw Card.  Occ bp spikes Discussed the use of AI scribe software for clinical note transcription with the patient, who gave verbal consent to proceed.  History of Present Illness Debra Martinez is a 63 year old female with arthritis and coronary artery disease who presents for follow-up of her symptoms and medication management.  She has ongoing arthritis in her hands, describing it as 'pretty miserable.' She has started Tai Chi twice a week, which has helped her almost make a fist again. She has not seen an orthopedic doctor or physical therapy for her hands. She has previously seen a rheumatologist, and rheumatoid arthritis was negative.  She experiences shortness of breath when going upstairs, which occurs sporadically. She disputes the attribution of this to deconditioning, as she exercises regularly and maintains her weight. She also experiences occasional blood pressure spikes but does not take medication for blood pressure.  She takes Imdur  daily for mild angina, which helps keep it at bay, although she still experiences random episodes of chest pain, sometimes radiating to her jaw. She uses nitroglycerin as needed for these episodes and takes aspirin and atorvastatin  40 mg daily. She maintains a regular exercise routine and keeps her weight down.  She reports frequent urination and constant thirst but denies any breathing issues.   She is on Luvox  50 mg daily for mood stabilization and denies any thoughts of suicide or homicide.  Her family history is significant for heart disease on both her father's and mother's sides. She is diligent about maintaining her health, including diet and  exercise.  She mentions her youngest son, who is on the autism spectrum and was adopted. He was born to a cocaine-addicted birth mother and underwent withdrawal after delivery. He is now doing well, studying fire sciences and emt, and is off all medications. He is becoming more outgoing and independent.    Health Maintenance Due  Topic Date Due   MAMMOGRAM  08/01/2017    Past Medical History:  Diagnosis Date   Allergy    Anxiety    Arthritis    Heart murmur    Hyperlipidemia     Past Surgical History:  Procedure Laterality Date   APPENDECTOMY     BILATERAL SALPINGECTOMY     ECTOPIC PREGNANCY SURGERY     KNEE ARTHROSCOPY Right 08/2021   TONSILLECTOMY       Current Outpatient Medications:    atorvastatin  (LIPITOR) 40 MG tablet, Take 1 tablet (40 mg total) by mouth daily., Disp: 90 tablet, Rfl: 3   azelastine  (ASTELIN ) 0.1 % nasal spray, Place 2 sprays into both nostrils 2 (two) times daily., Disp: 30 mL, Rfl: 12   diclofenac Sodium (VOLTAREN) 1 % GEL, Apply topically., Disp: , Rfl:    DOXYLAMINE SUCCINATE PO, Take by mouth., Disp: , Rfl:    fexofenadine  (ALLEGRA ) 180 MG tablet, Take 1 tablet (180 mg total) by mouth daily as needed for allergies or rhinitis., Disp: 90 tablet, Rfl: 3   ipratropium (ATROVENT ) 0.06 % nasal spray, USE 2 SPRAY(S) IN EACH NOSTRIL 4 TIMES DAILY, Disp: 45 mL, Rfl: 3   Magnesium 400 MG TABS, Take  by mouth., Disp: , Rfl:    methocarbamol  (ROBAXIN ) 500 MG tablet, TAKE 1 TABLET BY MOUTH EVERY 8 HOURS AS NEEDED FOR MUSCLE SPASM, Disp: 20 tablet, Rfl: 0   mirtazapine  (REMERON ) 15 MG tablet, Take 1 tablet (15 mg total) by mouth at bedtime., Disp: 90 tablet, Rfl: 3   fluvoxaMINE  (LUVOX ) 50 MG tablet, Take 1 tablet (50 mg total) by mouth at bedtime., Disp: 90 tablet, Rfl: 1   isosorbide  mononitrate (IMDUR ) 30 MG 24 hr tablet, Take 1 tablet (30 mg total) by mouth daily., Disp: 90 tablet, Rfl: 1   nitroGLYCERIN (NITROSTAT) 0.4 MG SL tablet, Place 1 tablet (0.4 mg  total) under the tongue every 5 (five) minutes as needed for chest pain., Disp: 25 tablet, Rfl: 1  Allergies  Allergen Reactions   Penicillin G Anaphylaxis   Sulfa Antibiotics Other (See Comments)    Other reaction(s): Dizziness (intolerance)   ROS neg/noncontributory except as noted HPI/below      Objective:      BP 114/65   Pulse 60   Temp (!) 97.5 F (36.4 C) (Temporal)   Resp 16   Ht 5\' 2"  (1.575 m)   Wt 146 lb 4 oz (66.3 kg)   SpO2 95%   BMI 26.75 kg/m  Wt Readings from Last 3 Encounters:  01/28/24 146 lb 4 oz (66.3 kg)  10/01/23 150 lb 9.6 oz (68.3 kg)  07/30/23 148 lb 2 oz (67.2 kg)    Physical Exam   Gen: WDWN NAD HEENT: NCAT, conjunctiva not injected, sclera nonicteric NECK:  supple, no thyromegaly, no nodes, no carotid bruits CARDIAC: RRR, S1S2+, no murmur. DP 2+B LUNGS: CTAB. No wheezes ABDOMEN:  BS+, soft, NTND, No HSM, no masses EXT:  no edema MSK: no gross abnormalities.  NEURO: A&O x3.  CN II-XII intact.  PSYCH: normal mood. Good eye contact     Assessment & Plan:  Arthritis of both hands -     Ambulatory referral to Physical Therapy  GAD (generalized anxiety disorder) -     fluvoxaMINE  Maleate; Take 1 tablet (50 mg total) by mouth at bedtime.  Dispense: 90 tablet; Refill: 1  Angina pectoris (HCC) -     Isosorbide  Mononitrate ER; Take 1 tablet (30 mg total) by mouth daily.  Dispense: 90 tablet; Refill: 1 -     Nitroglycerin; Place 1 tablet (0.4 mg total) under the tongue every 5 (five) minutes as needed for chest pain.  Dispense: 25 tablet; Refill: 1 -     CBC with Differential/Platelet -     Comprehensive metabolic panel with GFR -     Hemoglobin A1c  Prediabetes -     CBC with Differential/Platelet -     Comprehensive metabolic panel with GFR -     Hemoglobin A1c  Pure hypercholesterolemia  Assessment and Plan Assessment & Plan Angina pectoris   She experiences intermittent chest pain, sometimes radiating to the jaw, managed with  Imdur  and nitroglycerin as needed. Episodes occur randomly and are sometimes associated with exertion, such as climbing stairs. There is a family history of heart disease. A cardiologist suggested deconditioning as a cause of symptoms, which she disputes. Current management includes daily aspirin and atorvastatin , but she experiences breakthrough angina. Continue Imdur  daily, and continue aspirin and atorvastatin  for cardiovascular protection. Prescribe new nitroglycerin tablets due to potential expiration of the current supply.  Elevated bp  She has occasional blood pressure spikes but is not on antihypertensive medication. Current management includes lifestyle modifications and  monitoring.  Osteoarthritis of hand   Chronic osteoarthritis in her hands causes significant discomfort. Rheumatoid arthritis has been ruled out. She engages in [place] and hand exercises, which have shown some improvement in hand function. Considering physical therapy for further management. She is not interested in joint fusion or replacement at this time. Refer to physical therapy for hand exercises and potential splint recommendations.  Depression/anxiety Current management with Luvox  maintains mood stability.  General Health Maintenance   A mammogram is due. A colonoscopy was completed in 2020, and she declines further colonoscopy but is open to alternative screening methods. Schedule a mammogram and consider alternative colorectal cancer screening methods such as hemoccult tests.  preDM-working on diet/exercise HLD-chronic.  Controlled.  Continue atorvastatin  40mg     Return in about 6 months (around 07/30/2024) for chronic follow-up, annual physical.  Ellsworth Haas, MD

## 2024-01-28 NOTE — Progress Notes (Signed)
 Stable.  Keep working on diet/exercise

## 2024-01-29 ENCOUNTER — Other Ambulatory Visit: Payer: Self-pay | Admitting: Family Medicine

## 2024-01-29 DIAGNOSIS — Z1231 Encounter for screening mammogram for malignant neoplasm of breast: Secondary | ICD-10-CM

## 2024-01-30 ENCOUNTER — Ambulatory Visit
Admission: RE | Admit: 2024-01-30 | Discharge: 2024-01-30 | Disposition: A | Discharge status: Home / Self Care | Source: Ambulatory Visit

## 2024-01-30 DIAGNOSIS — Z1231 Encounter for screening mammogram for malignant neoplasm of breast: Secondary | ICD-10-CM | POA: Diagnosis not present

## 2024-02-05 ENCOUNTER — Ambulatory Visit: Payer: Self-pay | Admitting: Family Medicine

## 2024-02-05 ENCOUNTER — Other Ambulatory Visit: Payer: Self-pay | Admitting: Family Medicine

## 2024-02-05 DIAGNOSIS — R928 Other abnormal and inconclusive findings on diagnostic imaging of breast: Secondary | ICD-10-CM

## 2024-02-18 ENCOUNTER — Ambulatory Visit
Admission: RE | Admit: 2024-02-18 | Discharge: 2024-02-18 | Disposition: A | Discharge status: Home / Self Care | Source: Ambulatory Visit | Attending: Family Medicine | Admitting: Family Medicine

## 2024-02-18 ENCOUNTER — Ambulatory Visit: Payer: Self-pay | Admitting: Family Medicine

## 2024-02-18 ENCOUNTER — Ambulatory Visit

## 2024-02-18 DIAGNOSIS — R928 Other abnormal and inconclusive findings on diagnostic imaging of breast: Secondary | ICD-10-CM

## 2024-02-19 ENCOUNTER — Encounter: Payer: Self-pay | Admitting: Nurse Practitioner

## 2024-02-27 ENCOUNTER — Ambulatory Visit: Payer: Self-pay

## 2024-02-27 NOTE — Telephone Encounter (Signed)
 Patient notified of message below and stated that  I am not going to ER, I am just going to rest

## 2024-02-27 NOTE — Telephone Encounter (Signed)
 Copied from CRM [id number]. Topic: Clinical - Red Word Triage >> Feb 27, 2024  8:37 AM Alyse July wrote: Red Word that prompted transfer to Nurse Triage: Shortness of Breath Reason for Disposition  [1] MODERATE difficulty breathing (e.g., speaks in phrases, SOB even at rest, pulse 100-120) AND [2] NEW-onset or WORSE than normal  Answer Assessment - Initial Assessment Questions Pt declined ED, stated she will not go due to costs and hung up on RN.   1. RESPIRATORY STATUS: Describe your breathing? (e.g., wheezing, shortness of breath, unable to speak, severe coughing)      Shortness of breath 2. ONSET: When did this breathing problem begin?      I haven't been feeling well for a few days now, coughing, congestion, especially at nighttime. I thought it was allergies. 3. PATTERN Does the difficult breathing come and go, or has it been constant since it started?      Comes and goes 4. SEVERITY: How bad is your breathing? (e.g., mild, moderate, severe)    - MILD: No SOB at rest, mild SOB with walking, speaks normally in sentences, can lie down, no retractions, pulse < 100.    - MODERATE: SOB at rest, SOB with minimal exertion and prefers to sit, cannot lie down flat, speaks in phrases, mild retractions, audible wheezing, pulse 100-120.    - SEVERE: Very SOB at rest, speaks in single words, struggling to breathe, sitting hunched forward, retractions, pulse > 120      Sometimes when I am sitting down as well 5. RECURRENT SYMPTOM: Have you had difficulty breathing before? If Yes, ask: When was the last time? and What happened that time?      Sometimes I do have SOB, I do have some mild angina (no CP right now) 6. CARDIAC HISTORY: Do you have any history of heart disease? (e.g., heart attack, angina, bypass surgery, angioplasty)      Angina pectoris  7. LUNG HISTORY: Do you have any history of lung disease?  (e.g., pulmonary embolus, asthma, emphysema)     Denies  8. CAUSE:  What do you think is causing the breathing problem?      Not sure  9. OTHER SYMPTOMS: Do you have any other symptoms? (e.g., dizziness, runny nose, cough, chest pain, fever)      I've had blood pressure issues, it goes way high and then down low. Denies CP. 130/78 and then 90/58 (example of fluctuation -- states her machine is not the best). HR 55. Endorses dizziness. On Wednesday my blood pressure was very low and I really thought I was going to pass out. Pt denies feeling like that today.  10. O2 SATURATION MONITOR:  Do you use an oxygen saturation monitor (pulse oximeter) at home? If Yes, ask: What is your reading (oxygen level) today? What is your usual oxygen saturation reading? (e.g., 95%)  Protocols used: Breathing Difficulty-A-AH

## 2024-03-03 ENCOUNTER — Ambulatory Visit: Admitting: Physical Therapy

## 2024-03-16 ENCOUNTER — Other Ambulatory Visit: Payer: Self-pay | Admitting: Family Medicine

## 2024-03-16 DIAGNOSIS — I209 Angina pectoris, unspecified: Secondary | ICD-10-CM

## 2024-04-18 ENCOUNTER — Other Ambulatory Visit: Payer: Self-pay | Admitting: Family Medicine

## 2024-04-18 DIAGNOSIS — I209 Angina pectoris, unspecified: Secondary | ICD-10-CM

## 2024-04-19 ENCOUNTER — Other Ambulatory Visit: Payer: Self-pay | Admitting: Family Medicine

## 2024-04-19 DIAGNOSIS — I209 Angina pectoris, unspecified: Secondary | ICD-10-CM

## 2024-04-27 ENCOUNTER — Ambulatory Visit: Payer: Self-pay

## 2024-04-27 DIAGNOSIS — S61432A Puncture wound without foreign body of left hand, initial encounter: Secondary | ICD-10-CM | POA: Diagnosis not present

## 2024-04-27 NOTE — Telephone Encounter (Signed)
 No availability in office, pt states will go to [facility].   FYI Only or Action Required?: FYI only for provider.  Patient was last seen in primary care on 01/28/2024 by Wendolyn Jenkins Jansky, MD.  Called Nurse Triage reporting Animal Bite.  Symptoms began yesterday.  Interventions attempted: Other: cleaned.  Symptoms are: rapidly worsening.  Triage Disposition: See HCP Within 4 Hours (Or PCP Triage)  Patient/caregiver understands and will follow disposition?: Yes Reason for Disposition  [1] Puncture wound (hole through the skin) AND [2] from a cat bite (or deep claw puncture wound)  Looks infected (spreading redness, red streak, pus)  Answer Assessment - Initial Assessment Questions 1. APPEARANCE What does it look like?  (e.g., abrasion, bruise, puncture)      Appears infected, pus present  3. LOCATION: Where is the bite located?      Left wrist  4. ONSET: When did the bite happen? (e.g., minutes, hours ago)      Yesterday  5. ANIMAL: What type of animal caused the bite? Is the injury from a bite or a claw? If the animal is a dog or a cat, ask: Was it a pet or a stray? Was it acting ill or behaving strangely?     Cat - no abnormal behavior  6. RABIES VACCINE: For dog or cat bites, ask: Do you know if the pet is vaccinated against rabies?  (e.g., yes, no, overdue for rabies shot, unknown)     Up to date  8. TETANUS: When was your last tetanus booster?     Unknown  Protocols used: Animal Bite-A-AH Copied from CRM [id number]. Topic: Clinical - Red Word Triage >> Apr 27, 2024 11:00 AM Viola F wrote: Patient got bit by cat yesterday, she thinks it's now infected - there is pus and area is swelling

## 2024-04-27 NOTE — Telephone Encounter (Signed)
 FYI

## 2024-06-17 ENCOUNTER — Ambulatory Visit: Payer: Self-pay

## 2024-06-17 NOTE — Telephone Encounter (Signed)
 Appt tomorrow

## 2024-06-17 NOTE — Telephone Encounter (Signed)
 FYI Only or Action Required?: FYI only for provider.  Patient was last seen in primary care on 01/28/2024 by Wendolyn Jenkins Jansky, MD.  Called Nurse Triage reporting Cough and Headache.  Symptoms began several days ago.  Interventions attempted: Rest, hydration, or home remedies.  Symptoms are: unchanged.  Triage Disposition: Call PCP Now, Call PCP Within 24 Hours  Patient/caregiver understands and will follow disposition?: No, refuses disposition   Copied from CRM #[id number]. Topic: Clinical - Red Word Triage >> Jun 17, 2024 12:01 PM [place] A wrote: [facility] that prompted transfer to Nurse Triage: Pt returned from a trip and now has a headache, naausea, coughing and feeling tired Reason for Disposition  Travel to a foreign country in past month  [1] Angry or rude caller AND [2] doesn't respond to 5 minutes of triager counseling AND [3] sick adult (or caller)  Answer Assessment - Initial Assessment Questions Additional info: Patient very difficulty to triage, after each question she is sighing and saying can't I just have an appointment. Attempted to educate patient on importance of thorough triage but she is not open to education.    1. DIARRHEA SEVERITY: How bad is the diarrhea? How many more stools have you had in the past 24 hours than normal?      2-3 times per day 2. ONSET: When did the diarrhea begin?      2 days 3. STOOL DESCRIPTION:  How loose or watery is the diarrhea? What is the stool color? Is there any blood or mucous in the stool?     Loose stool 4. VOMITING: Are you also vomiting? If Yes, ask: How many times in the past 24 hours?      nausea 5. ABDOMEN PAIN: Are you having any abdomen pain? If Yes, ask: What does it feel like? (e.g., crampy, dull, intermittent, constant)      Cramping  6. ABDOMEN PAIN SEVERITY: If present, ask: How bad is the pain?  (e.g., Scale 1-10; mild, moderate, or severe)     Denies  7. ORAL INTAKE: If vomiting, Have  you been able to drink liquids? How much liquids have you had in the past 24 hours?     Not vomiting  8. HYDRATION: Any signs of dehydration? (e.g., dry mouth [not just dry lips], too weak to stand, dizziness, new weight loss) When did you last urinate?     Not answered  9. EXPOSURE: Have you traveled to a foreign country recently? Have you been exposed to anyone with diarrhea? Could you have eaten any food that was spoiled?     Return from abroad 10. ANTIBIOTIC USE: Are you taking antibiotics now or have you taken antibiotics in the past 2 months?       Unsure-difficulty patient to triage 11. OTHER SYMPTOMS: Do you have any other symptoms? (e.g., fever, blood in stool)       Cough-denies sob, headache-denies vision changes, tingling and numbness.  Answer Assessment - Initial Assessment Questions 1. SITUATION:  Document reason for call.     Want to schedule appointment without triage  Protocols used: Diarrhea-A-AH, Difficult Call-A-AH

## 2024-06-18 ENCOUNTER — Encounter: Payer: Self-pay | Admitting: Family Medicine

## 2024-06-18 ENCOUNTER — Ambulatory Visit: Admitting: Family Medicine

## 2024-06-18 VITALS — BP 110/60 | HR 67 | Temp 98.4°F | Ht 62.0 in | Wt 146.0 lb

## 2024-06-18 DIAGNOSIS — M19041 Primary osteoarthritis, right hand: Secondary | ICD-10-CM | POA: Diagnosis not present

## 2024-06-18 DIAGNOSIS — R509 Fever, unspecified: Secondary | ICD-10-CM | POA: Diagnosis not present

## 2024-06-18 DIAGNOSIS — M19042 Primary osteoarthritis, left hand: Secondary | ICD-10-CM | POA: Diagnosis not present

## 2024-06-18 DIAGNOSIS — R051 Acute cough: Secondary | ICD-10-CM | POA: Diagnosis not present

## 2024-06-18 LAB — POC COVID19 BINAXNOW: SARS Coronavirus 2 Ag: NEGATIVE

## 2024-06-18 MED ORDER — BENZONATATE 100 MG PO CAPS: 100.0000 mg | ORAL_CAPSULE | Freq: Three times a day (TID) | ORAL | 0 refills | Status: DC | PRN

## 2024-06-18 MED ORDER — CELECOXIB 200 MG PO CAPS: 200.0000 mg | ORAL_CAPSULE | Freq: Two times a day (BID) | ORAL | 1 refills | Status: DC

## 2024-06-18 NOTE — Patient Instructions (Signed)
 It was very nice to see you today!  Tumeric/ginger caps for pain  Will try celebrex.  Continue tylenol.    PLEASE NOTE:  If you had any lab tests please let us  know if you have not heard back within a few days. You may see your results on MyChart before we have a chance to review them but we will give you a call once they are reviewed by us . If we ordered any referrals today, please let us  know if you have not heard from their office within the next week.   Please try these tips to maintain a healthy lifestyle:  Eat most of your calories during the day when you are active. Eliminate processed foods including packaged sweets (pies, cakes, cookies), reduce intake of potatoes, white bread, white pasta, and white rice. Look for whole grain options, oat flour or almond flour.  Each meal should contain half fruits/vegetables, one quarter protein, and one quarter carbs (no bigger than a computer mouse).  Cut down on sweet beverages. This includes juice, soda, and sweet tea. Also watch fruit intake, though this is a healthier sweet option, it still contains natural sugar! Limit to 3 servings daily.  Drink at least 1 glass of water with each meal and aim for at least 8 glasses per day  Exercise at least 150 minutes every week.

## 2024-06-18 NOTE — Progress Notes (Signed)
 Subjective:     Patient ID: Debra Martinez, female    DOB: 1961-07-09, 63 y.o.   MRN: [id number]  Chief Complaint  Patient presents with   Cough    Dry; have had some nausea as well ; started feeling bad yesterday   Headache    Low fever yesterday; took some tylenol   Diarrhea   Hand Pain    So much pain from arthritis; want to discuss possible ortho    HPI Discussed the use of AI scribe software for clinical note transcription with the patient, who gave verbal consent to proceed.  History of Present Illness Debra Martinez is a 63 year old female who presents with symptoms of a viral illness.  Symptoms began yesterday, including cough, headache, fever, nausea, rhinorrhea, nasal congestion, and diarrhea. No sore throat or significant myalgias, but she feels generally 'lousy'.  She has a history of arthritis, particularly affecting her hands, which has been exacerbated recently. Pain interrupts her sleep, and she has been using ibuprofen and acetaminophen for relief. However, ibuprofen has caused gastrointestinal discomfort, so she has been primarily using acetaminophen. She has not tried other anti-inflammatory medications like meloxicam or celecoxib.  She reports a recent wrist sprain approximately two and a half to three weeks ago, which she believes has aggravated her arthritis symptoms. Swelling and pain in her hands, particularly in the thumbs and fingers, have been present.  She mentions a recent cruise with a friend, which was challenging due to her friend's obesity, but she is pleased she did not gain much weight during the trip.    Health Maintenance Due  Topic Date Due   Pneumococcal Vaccine: 50+ Years (1 of 2 - PCV) Never done   Influenza Vaccine  04/17/2024   COVID-19 Vaccine (5 - 2025-26 season) 05/18/2024    Past Medical History:  Diagnosis Date   Allergy    Anxiety    Arthritis    Heart murmur    Hyperlipidemia     Past Surgical History:  Procedure  Laterality Date   APPENDECTOMY     BILATERAL SALPINGECTOMY     ECTOPIC PREGNANCY SURGERY     KNEE ARTHROSCOPY Right 08/2021   TONSILLECTOMY       Current Outpatient Medications:    atorvastatin  (LIPITOR) 40 MG tablet, Take 1 tablet (40 mg total) by mouth daily., Disp: 90 tablet, Rfl: 3   azelastine  (ASTELIN ) 0.1 % nasal spray, Place 2 sprays into both nostrils 2 (two) times daily., Disp: 30 mL, Rfl: 12   diclofenac Sodium (VOLTAREN) 1 % GEL, Apply topically., Disp: , Rfl:    DOXYLAMINE SUCCINATE PO, Take by mouth., Disp: , Rfl:    fexofenadine  (ALLEGRA ) 180 MG tablet, Take 1 tablet (180 mg total) by mouth daily as needed for allergies or rhinitis., Disp: 90 tablet, Rfl: 3   fluvoxaMINE  (LUVOX ) 50 MG tablet, Take 1 tablet (50 mg total) by mouth at bedtime., Disp: 90 tablet, Rfl: 1   ipratropium (ATROVENT ) 0.06 % nasal spray, USE 2 SPRAY(S) IN EACH NOSTRIL 4 TIMES DAILY, Disp: 45 mL, Rfl: 3   isosorbide  mononitrate (IMDUR ) 30 MG 24 hr tablet, Take 1 tablet (30 mg total) by mouth daily., Disp: 90 tablet, Rfl: 1   Magnesium 400 MG TABS, Take by mouth., Disp: , Rfl:    methocarbamol  (ROBAXIN ) 500 MG tablet, TAKE 1 TABLET BY MOUTH EVERY 8 HOURS AS NEEDED FOR MUSCLE SPASM, Disp: 20 tablet, Rfl: 0   mirtazapine  (REMERON ) 15 MG tablet, Take 1  tablet (15 mg total) by mouth at bedtime., Disp: 90 tablet, Rfl: 3   nitroGLYCERIN  (NITROSTAT ) 0.4 MG SL tablet, DISSOLVE ONE TABLET UNDER THE TONGUE EVERY 5 MINUTES AS NEEDED FOR CHEST PAIN., Disp: 25 tablet, Rfl: 0   benzonatate (TESSALON PERLES) 100 MG capsule, Take 1 capsule (100 mg total) by mouth 3 (three) times daily as needed., Disp: 20 capsule, Rfl: 0   celecoxib (CELEBREX) 200 MG capsule, Take 1 capsule (200 mg total) by mouth 2 (two) times daily., Disp: 60 capsule, Rfl: 1  Allergies  Allergen Reactions   Penicillin G Anaphylaxis   Sulfa Antibiotics Other (See Comments)    Other reaction(s): Dizziness (intolerance)   ROS neg/noncontributory  except as noted HPI/below      Objective:     BP 110/60   Pulse 67   Temp 98.4 F (36.9 C)   Ht 5' 2 (1.575 m)   Wt 146 lb (66.2 kg)   SpO2 99%   BMI 26.70 kg/m  Wt Readings from Last 3 Encounters:  06/18/24 146 lb (66.2 kg)  01/28/24 146 lb 4 oz (66.3 kg)  10/01/23 150 lb 9.6 oz (68.3 kg)    Physical Exam   Gen: WDWN NAD HEENT: NCAT, conjunctiva not injected, sclera nonicteric TM WNL B, OP moist, no exudates  NECK:  supple, no thyromegaly, no nodes, no carotid bruits CARDIAC: RRR, S1S2+, no murmur. DP 2+B LUNGS: CTAB. No wheezes ABDOMEN:  BS+, soft, NTND, No HSM, no masses EXT:  no edema MSK: no gross abnormalities.  X enlarged dip and pip hands NEURO: A&O x3.  CN II-XII intact.  PSYCH: normal mood. Good eye contact  Results for orders placed or performed in visit on 06/18/24  POC COVID-19   Collection Time: 06/18/24  9:24 AM  Result Value Ref Range   SARS Coronavirus 2 Ag Negative Negative        Assessment & Plan:  Arthritis of both hands -     Ambulatory referral to Orthopedic Surgery -     Celecoxib; Take 1 capsule (200 mg total) by mouth 2 (two) times daily.  Dispense: 60 capsule; Refill: 1  Fever, unspecified fever cause -     POC COVID-19 BinaxNow  Acute cough -     POC COVID-19 BinaxNow  Other orders -     Benzonatate; Take 1 capsule (100 mg total) by mouth 3 (three) times daily as needed.  Dispense: 20 capsule; Refill: 0  Assessment and Plan Assessment & Plan Acute viral upper respiratory infection   Symptoms began yesterday, including cough, headache, fever, nausea, rhinorrhea, congestion, and diarrhea. Today's COVID-19 test was negative, but it may be too early to detect. Differential includes COVID-19 or another viral infection, with no indication of bacterial infection at this stage. Recheck COVID-19 home test tomorrow if symptoms persist. Recommend symptomatic treatment with acetaminophen for fever and myalgia. Advise rest and home  isolation. Instruct to monitor symptoms and report any changes.  Sprain of right wrist, subsequent encounter   The right wrist sprain occurred approximately two and a half to three weeks ago, contributing to exacerbation of hand pain.  Primary osteoarthritis of right hand   Chronic pain and swelling in the right hand, particularly affecting the thumbs and fingers, exacerbated by the recent wrist sprain. Ibuprofen caused gastrointestinal issues, so switched to acetaminophen. Discussed alternative treatments including celecoxib and turmeric, with caution advised regarding turmeric due to potential hepatotoxicity in high doses. Prescribe celecoxib for osteoarthritis pain management. Continue acetaminophen as needed.  Refer to hand orthopedic specialist at [facility]. Discuss potential use of turmeric and ginger capsules with caution.    Return for as sch 11/17.  Jenkins CHRISTELLA Carrel, MD

## 2024-07-13 ENCOUNTER — Other Ambulatory Visit: Payer: Self-pay | Admitting: Family Medicine

## 2024-07-13 DIAGNOSIS — F411 Generalized anxiety disorder: Secondary | ICD-10-CM

## 2024-07-13 DIAGNOSIS — I209 Angina pectoris, unspecified: Secondary | ICD-10-CM

## 2024-08-03 ENCOUNTER — Encounter: Admitting: Family Medicine

## 2024-08-04 ENCOUNTER — Ambulatory Visit: Admitting: Family Medicine

## 2024-08-04 ENCOUNTER — Ambulatory Visit: Payer: Self-pay | Admitting: Family Medicine

## 2024-08-04 ENCOUNTER — Encounter: Payer: Self-pay | Admitting: Family Medicine

## 2024-08-04 VITALS — BP 112/62 | HR 76 | Temp 97.2°F | Ht 62.0 in | Wt 149.5 lb

## 2024-08-04 DIAGNOSIS — R079 Chest pain, unspecified: Secondary | ICD-10-CM | POA: Diagnosis not present

## 2024-08-04 DIAGNOSIS — E7800 Pure hypercholesterolemia, unspecified: Secondary | ICD-10-CM | POA: Diagnosis not present

## 2024-08-04 DIAGNOSIS — R5383 Other fatigue: Secondary | ICD-10-CM

## 2024-08-04 DIAGNOSIS — E559 Vitamin D deficiency, unspecified: Secondary | ICD-10-CM | POA: Diagnosis not present

## 2024-08-04 DIAGNOSIS — R8290 Unspecified abnormal findings in urine: Secondary | ICD-10-CM

## 2024-08-04 DIAGNOSIS — F411 Generalized anxiety disorder: Secondary | ICD-10-CM

## 2024-08-04 DIAGNOSIS — R7303 Prediabetes: Secondary | ICD-10-CM

## 2024-08-04 DIAGNOSIS — R202 Paresthesia of skin: Secondary | ICD-10-CM | POA: Diagnosis not present

## 2024-08-04 DIAGNOSIS — M19042 Primary osteoarthritis, left hand: Secondary | ICD-10-CM

## 2024-08-04 DIAGNOSIS — M19041 Primary osteoarthritis, right hand: Secondary | ICD-10-CM

## 2024-08-04 DIAGNOSIS — F5101 Primary insomnia: Secondary | ICD-10-CM

## 2024-08-04 DIAGNOSIS — I209 Angina pectoris, unspecified: Secondary | ICD-10-CM

## 2024-08-04 LAB — POC URINALSYSI DIPSTICK (AUTOMATED)
Bilirubin, UA: NEGATIVE
Blood, UA: NEGATIVE
Glucose, UA: NEGATIVE
Ketones, UA: NEGATIVE
Leukocytes, UA: NEGATIVE
Nitrite, UA: NEGATIVE
Protein, UA: NEGATIVE
Spec Grav, UA: 1.015 (ref 1.010–1.025)
Urobilinogen, UA: NEGATIVE U/dL — AB
pH, UA: 6.5 (ref 5.0–8.0)

## 2024-08-04 LAB — LIPID PANEL
Cholesterol: 99 mg/dL (ref 0–200)
HDL: 40.4 mg/dL (ref >39.00)
LDL Cholesterol: 35 mg/dL (ref 0–99)
NonHDL: 58.42
Total CHOL/HDL Ratio: 2
Triglycerides: 118 mg/dL (ref 0.0–149.0)
VLDL: 23.6 mg/dL (ref 0.0–40.0)

## 2024-08-04 LAB — CBC WITH DIFFERENTIAL/PLATELET
Basophils Absolute: 0 K/uL (ref 0.0–0.1)
Basophils Relative: 0.5 % (ref 0.0–3.0)
Eosinophils Absolute: 0.1 K/uL (ref 0.0–0.7)
Eosinophils Relative: 1.3 % (ref 0.0–5.0)
HCT: 43 % (ref 36.0–46.0)
Hemoglobin: 14.2 g/dL (ref 12.0–15.0)
Lymphocytes Relative: 27.4 % (ref 12.0–46.0)
Lymphs Abs: 1.1 K/uL (ref 0.7–4.0)
MCHC: 33 g/dL (ref 30.0–36.0)
MCV: 90.6 fl (ref 78.0–100.0)
Monocytes Absolute: 0.2 K/uL (ref 0.1–1.0)
Monocytes Relative: 5.3 % (ref 3.0–12.0)
Neutro Abs: 2.6 K/uL (ref 1.4–7.7)
Neutrophils Relative %: 65.5 % (ref 43.0–77.0)
Platelets: 128 K/uL — ABNORMAL LOW (ref 150.0–400.0)
RBC: 4.75 Mil/uL (ref 3.87–5.11)
RDW: 14 % (ref 11.5–15.5)
WBC: 3.9 K/uL — ABNORMAL LOW (ref 4.0–10.5)

## 2024-08-04 LAB — TSH: TSH: 1.36 u[IU]/mL (ref 0.35–5.50)

## 2024-08-04 LAB — VITAMIN D 25 HYDROXY (VIT D DEFICIENCY, FRACTURES): VITD: 66 ng/mL (ref 30.00–100.00)

## 2024-08-04 LAB — COMPREHENSIVE METABOLIC PANEL WITH GFR
ALT: 25 U/L (ref 0–35)
AST: 22 U/L (ref 0–37)
Albumin: 4.5 g/dL (ref 3.5–5.2)
Alkaline Phosphatase: 60 U/L (ref 39–117)
BUN: 17 mg/dL (ref 6–23)
CO2: 28 meq/L (ref 19–32)
Calcium: 9.5 mg/dL (ref 8.4–10.5)
Chloride: 107 meq/L (ref 96–112)
Creatinine, Ser: 0.81 mg/dL (ref 0.40–1.20)
GFR: 76.51 mL/min (ref >60.00)
Glucose, Bld: 80 mg/dL (ref 70–99)
Potassium: 4.1 meq/L (ref 3.5–5.1)
Sodium: 142 meq/L (ref 135–145)
Total Bilirubin: 0.5 mg/dL (ref 0.2–1.2)
Total Protein: 6.2 g/dL (ref 6.0–8.3)

## 2024-08-04 LAB — VITAMIN B12: Vitamin B-12: 267 pg/mL (ref 211–911)

## 2024-08-04 LAB — HEMOGLOBIN A1C: Hgb A1c MFr Bld: 5.9 % (ref 4.6–6.5)

## 2024-08-04 MED ORDER — FLUVOXAMINE MALEATE 50 MG PO TABS: 50.0000 mg | ORAL_TABLET | Freq: Every day | ORAL | 1 refills | Status: AC

## 2024-08-04 MED ORDER — ATORVASTATIN CALCIUM 40 MG PO TABS: 40.0000 mg | ORAL_TABLET | Freq: Every day | ORAL | 3 refills | Status: AC

## 2024-08-04 MED ORDER — MIRTAZAPINE 15 MG PO TABS: 15.0000 mg | ORAL_TABLET | Freq: Every day | ORAL | 3 refills | Status: AC

## 2024-08-04 MED ORDER — ISOSORBIDE MONONITRATE ER 30 MG PO TB24: 30.0000 mg | ORAL_TABLET | Freq: Every day | ORAL | 0 refills | Status: AC

## 2024-08-04 MED ORDER — CELECOXIB 200 MG PO CAPS: 200.0000 mg | ORAL_CAPSULE | Freq: Two times a day (BID) | ORAL | 5 refills | Status: AC

## 2024-08-04 NOTE — Progress Notes (Signed)
 Subjective:     Patient ID: Debra Martinez, female    DOB: 01-17-1961, 63 y.o.   MRN: [id number]  Chief Complaint  Patient presents with   Hyperlipidemia    Pt is here to go over medications  Predm,hld,edema, oa,angina,anx  Discussed the use of AI scribe software for clinical note transcription with the patient, who gave verbal consent to proceed.  History of Present Illness Debra Martinez is a 63 year old female with angina who presents for follow-up.  She experiences episodes of elevated blood pressure, with the highest recorded at 142/unknown, typically occurring at rest and not associated with physical activity. These episodes sometimes occur while sitting. She takes Imdur  (isosorbide  mononitrate) 30 mg daily and uses nitroglycerin  for angina, which resolves her chest pain. She experienced chest pain while sitting and knitting, requiring two doses of nitroglycerin  within 15 minutes. No chest pain during exercise, but she reports occasional shortness of breath after walking. Things improved w/imdur  in general  She describes persistent fatigue, feeling tired by 8 PM, and occasional dizziness, particularly during activities like shopping. She has a history of angina and has not seen a cardiologist recently. Her last EKG was normal, but no stress test was performed. She has a family history of heart disease, with her paternal grandfather dying at 9 from heart-related issues and her mother having a weak heart.  She reports a change in urine odor over the past few months, describing it as a 'funny odor' without burning or increased frequency, although she gets up more often at night. She acknowledges not drinking enough water, especially in colder weather.  She experiences tingling and numbness in her hands and feet, which improves with magnesium supplementation. She has not seen an orthopedist for these symptoms.  She takes Celebrex (celecoxib) for arthritis pain, which she uses  intermittently, and finds relief with Tai Chi and Voltaren. Her blood pressure spikes are not correlated with Celebrex use.  She is on Luvox  for depression and anxiety, and mirtazapine  at bedtime, which she feels helps her mood, though she is not 'happy go lucky'. No suicidal thoughts.  She mentions a persistent cough without heartburn.    There are no preventive care reminders to display for this patient.  Past Medical History:  Diagnosis Date   Allergy    Anxiety    Arthritis    Heart murmur    Hyperlipidemia     Past Surgical History:  Procedure Laterality Date   APPENDECTOMY     BILATERAL SALPINGECTOMY     ECTOPIC PREGNANCY SURGERY     KNEE ARTHROSCOPY Right 08/2021   TONSILLECTOMY       Current Outpatient Medications:    diclofenac Sodium (VOLTAREN) 1 % GEL, Apply topically., Disp: , Rfl:    fexofenadine  (ALLEGRA ) 180 MG tablet, Take 1 tablet (180 mg total) by mouth daily as needed for allergies or rhinitis., Disp: 90 tablet, Rfl: 3   Magnesium 400 MG TABS, Take by mouth., Disp: , Rfl:    nitroGLYCERIN  (NITROSTAT ) 0.4 MG SL tablet, DISSOLVE ONE TABLET UNDER THE TONGUE EVERY 5 MINUTES AS NEEDED FOR CHEST PAIN., Disp: 25 tablet, Rfl: 0   atorvastatin  (LIPITOR) 40 MG tablet, Take 1 tablet (40 mg total) by mouth daily., Disp: 90 tablet, Rfl: 3   celecoxib (CELEBREX) 200 MG capsule, Take 1 capsule (200 mg total) by mouth 2 (two) times daily., Disp: 60 capsule, Rfl: 5   fluvoxaMINE  (LUVOX ) 50 MG tablet, Take 1 tablet (50 mg total) by mouth  at bedtime., Disp: 90 tablet, Rfl: 1   isosorbide  mononitrate (IMDUR ) 30 MG 24 hr tablet, Take 1 tablet (30 mg total) by mouth daily., Disp: 90 tablet, Rfl: 0   mirtazapine  (REMERON ) 15 MG tablet, Take 1 tablet (15 mg total) by mouth at bedtime., Disp: 90 tablet, Rfl: 3  Allergies  Allergen Reactions   Penicillin G Anaphylaxis   Sulfa Antibiotics Other (See Comments)    Other reaction(s): Dizziness (intolerance)   ROS  neg/noncontributory except as noted HPI/below      Objective:     BP 112/62 (BP Location: Left Arm, Patient Position: Sitting)   Pulse 76   Temp (!) 97.2 F (36.2 C) (Temporal)   Ht 5' 2 (1.575 m)   Wt 149 lb 8 oz (67.8 kg)   SpO2 98%   BMI 27.34 kg/m  Wt Readings from Last 3 Encounters:  08/04/24 149 lb 8 oz (67.8 kg)  06/18/24 146 lb (66.2 kg)  01/28/24 146 lb 4 oz (66.3 kg)    Physical Exam GENERAL: Well developed well nourished no acute distress HEAD EYES EARS NOSE THROAT: Normocephalic atraumatic, conjunctiva not injected, sclera nonicteric CARDIAC: Regular rate and rhythm, S1 S2 present , no murmur, dorsalis pedis 2 plus bilaterally NECK: Supple, no thyromegaly, no nodes, no carotid bruits LUNGS: Clear to auscultation bilaterally, no wheezes ABDOMEN: Bowel sounds present, soft, non tender non distended, no hepatosplenomegaly, no masses EXTREMITIES: No edema MUSCULOSKELETAL: No gross abnormalities NEUROLOGICAL: Alert and oriented x3, cranial nerves II through XII intact PSYCHIATRIC: Normal mood, good eye contact   Results for orders placed or performed in visit on 08/04/24  POCT Urinalysis Dipstick (Automated)   Collection Time: 08/04/24 11:08 AM  Result Value Ref Range   Color, UA yellow    Clarity, UA cloudy    Glucose, UA Negative Negative   Bilirubin, UA neg    Ketones, UA neg    Spec Grav, UA 1.015 1.010 - 1.025   Blood, UA neg    pH, UA 6.5 5.0 - 8.0   Protein, UA Negative Negative   Urobilinogen, UA negative (A) 0.2 or 1.0 E.U./dL   Nitrite, UA neg    Leukocytes, UA Negative Negative       Assessment & Plan:  Pure hypercholesterolemia -     Atorvastatin  Calcium ; Take 1 tablet (40 mg total) by mouth daily.  Dispense: 90 tablet; Refill: 3 -     Comprehensive metabolic panel with GFR -     Lipid panel  Prediabetes -     Comprehensive metabolic panel with GFR -     Hemoglobin A1c  Primary insomnia -     Mirtazapine ; Take 1 tablet (15 mg total)  by mouth at bedtime.  Dispense: 90 tablet; Refill: 3  Chest pain syndrome -     Ambulatory referral to Cardiology  Arthritis of both hands -     Celecoxib ; Take 1 capsule (200 mg total) by mouth 2 (two) times daily.  Dispense: 60 capsule; Refill: 5  Vitamin D  deficiency -     VITAMIN D  25 Hydroxy (Vit-D Deficiency, Fractures)  GAD (generalized anxiety disorder) -     fluvoxaMINE  Maleate; Take 1 tablet (50 mg total) by mouth at bedtime.  Dispense: 90 tablet; Refill: 1  Other fatigue -     CBC with Differential/Platelet -     Comprehensive metabolic panel with GFR -     TSH  Paresthesia -     Vitamin B12  Angina pectoris -  Isosorbide  Mononitrate ER; Take 1 tablet (30 mg total) by mouth daily.  Dispense: 90 tablet; Refill: 0  Abnormal urine odor -     POCT Urinalysis Dipstick (Automated)    Assessment and Plan Assessment & Plan Angina pectoris   She experiences intermittent chest pain at rest, relieved by nitroglycerin , with concerns about cardiac issues due to family history and recent symptoms. Isosorbide  mononitrate effectively reduces nitroglycerin  use. She is hesitant to visit the ER due to cost but understands the need for care if symptoms persist despite nitroglycerin . Continue isosorbide  mononitrate 30 mg daily. Referred to a cardiologist for further evaluation, including a potential CT scan of the heart. Advised to seek emergency care if chest pain does not resolve with nitroglycerin .  Hypercholesterolemia   Chronic condition managed with atorvastatin . Continue atorvastatin  as prescribed.  Prediabetes   Recent dietary lapses due to a cruise, but she is back on track with diet and exercise. Continue current dietary and exercise regimen.  Primary osteoarthritis,  hand   Managed with celecoxib and Voltaren. Tai Chi reduces pain. Continue celecoxib and Voltaren as needed. Encouraged continuation of Tai Chi for joint health.  Generalized anxiety disorder and  depression   Managed with Luvox  and mirtazapine . Symptoms are well-managed with no suicidal ideation. Continue Luvox  and mirtazapine  as prescribed.  Fatigue   Chronic fatigue possibly related to overall health status and lifestyle factors. Encouraged continuation of current exercise regimen.  Paresthesia of hands and feet   Intermittent tingling and numbness, exacerbated by not taking magnesium. Continue magnesium supplementation.  Primary insomnia   Managed with mirtazapine  at bedtime. Continue mirtazapine  at bedtime.  Vitamin D  deficiency   Vitamin D  levels were normal a year ago. Ordered vitamin D  level test.  Elevated blood pressure readings   Intermittent elevated readings, possibly related to anxiety or medication use. No consistent pattern identified. Monitor blood pressure regularly. Discuss potential correlation with Celebrex use.  Abnormal urine odor   Recent onset of abnormal urine odor without burning or increased frequency. Possible dehydration or dietary factors considered. Increase water intake. Monitor for changes in urine odor.  Chronic cough   Persistent cough without smoking history. Possible silent reflux considered. Consider evaluation for silent reflux if symptoms persist.     Return in about 6 months (around 02/01/2025) for chronic follow-up.  Jenkins CHRISTELLA Carrel, MD

## 2024-08-04 NOTE — Progress Notes (Signed)
 Labs great except  A1C(3 month average of sugars) is elevated.  This is considered PreDiabetes.  Work on diet-decrease sugars and starches and aim for 30 minutes of exercise 5 days/week to prevent progression to diabetes  Wbc sl low and vitamin B12 low-take B12 vitamins or B complex otc

## 2024-08-04 NOTE — Patient Instructions (Signed)
 [facility] [address] [place] [address] [phone]

## 2024-08-05 NOTE — Progress Notes (Signed)
 Pt has read results smk

## 2024-10-23 ENCOUNTER — Ambulatory Visit: Payer: Self-pay | Admitting: Internal Medicine

## 2024-10-23 ENCOUNTER — Encounter: Payer: Self-pay | Admitting: Internal Medicine

## 2024-10-23 VITALS — BP 92/50 | HR 62 | Ht 62.0 in | Wt 154.8 lb

## 2024-10-23 DIAGNOSIS — R079 Chest pain, unspecified: Secondary | ICD-10-CM

## 2024-10-23 DIAGNOSIS — Z136 Encounter for screening for cardiovascular disorders: Secondary | ICD-10-CM

## 2024-10-23 DIAGNOSIS — E782 Mixed hyperlipidemia: Secondary | ICD-10-CM

## 2024-10-23 NOTE — Patient Instructions (Signed)
 Medication Instructions:  Your physician recommends that you continue on your current medications as directed. Please refer to the Current Medication list given to you today.  *If you need a refill on your cardiac medications before your next appointment, please call your pharmacy*  Testing/Procedures: Your physician has requested that you have an echocardiogram. Echocardiography is a painless test that uses sound waves to create images of your heart. It provides your doctor with information about the size and shape of your heart and how well your hearts chambers and valves are working. This procedure takes approximately one hour. There are no restrictions for this procedure. Please do NOT wear cologne, perfume, aftershave, or lotions (deodorant is allowed). Please arrive 15 minutes prior to your appointment time.  Please note: We ask at that you not bring children with you during ultrasound (echo/ vascular) testing. Due to room size and safety concerns, children are not allowed in the ultrasound rooms during exams. Our front office staff cannot provide observation of children in our lobby area while testing is being conducted. An adult accompanying a patient to their appointment will only be allowed in the ultrasound room at the discretion of the ultrasound technician under special circumstances. We apologize for any inconvenience.     Your physician has requested that you have an Exercise Stress Test.  Please arrive 15 minutes prior to your appointment time for registration.  The test will take approximately 45 minutes to complete.  Instructions: Beta blockers should be held for 24 hours prior to testing. Otherwise, you may take your medications the morning of the test (someone from the testing area will call you with these specific instructions). Light breakfast and or lunch. Dress prepared to exercise.  Please wear a 2-piece outfit (no dresses, overalls, etc.), and wear shoes appropriate  for walking (must be closed toe and heel). Do not wear cologne, perfume, aftershave or lotions (deodorant is allowed). If you use an inhaler, bring it with you to the test.  Please note: If anyone comes with you to the appointment, they will need to remain in the main lobby due to limited space in the testing area. We also ask that you not bring children with you during testing. Due to room size and safety concerns, children are not allowed in the testing rooms during exams. Our front office staff cannot provide observation of children in our lobby area while testing is being conducted.  Follow-Up: At [facility], you and your health needs are our priority.  As part of our continuing mission to provide you with exceptional heart care, our providers are all part of one team.  This team includes your primary Cardiologist (physician) and Advanced Practice Providers or APPs (Physician Assistants and Nurse Practitioners) who all work together to provide you with the care you need, when you need it.  Your next appointment:   4-6 week(s)  Provider:   Emeline FORBES Calender, DO or One of our Advanced Practice Providers (APPs): Morse Clause, PA-C  Hanh Waddell Daniels, PA-C  Saddie Cleaves, NP  Olivia Pavy, PA-C Shakari Shams, NP  Leontine Salen, PA-C Josefa Beauvais, NP  [facility], PA-C [place], PA-C  [place], PA-C Hunter Davis, [place]  Damien Braver, NP Jon Hails, PA-C  Waddell Donath, PA-C Dayna Dunn, PA-C  [place], PA-C Madison Fountain, NP Glendia Ferrier, PA-C Callie Goodrich, PA-C  Katlyn West, NP Thom Sluder, PA-C  Alyssa White, NP Rollo Louder, PA-C Xika Zhao, NP    Lamarr Satterfield, NP

## 2024-10-23 NOTE — Progress Notes (Signed)
 " Cardiology Office Note:  .   Date:  10/23/2024  ID:  Debra Martinez, DOB [birth year], MRN [id number] PCP: Wendolyn Jenkins Jansky, MD  [facility] Providers Cardiologist:  Emeline FORBES Calender, DO    History of Present Illness: .     Discussed the use of AI scribe software for clinical note transcription with the patient, who gave verbal consent to proceed.  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  Chest pain - Recurring intermittent chest pain over the past month - Pain is distinct from heartburn and lacks a bitter taste - Episodes occur at rest, particularly in the evening or middle of the day - Pain is not associated with exertion - Blood pressure increases to 160/unknown during episodes, then decreases with rest - Sublingual nitroglycerin  provides relief - Chronic chest pain previously evaluated with cardiac catheterization on May 25, 2022, showing no obstructive coronary disease - Echocardiogram in 2023 showed normal left and right ventricular function - Currently taking atorvastatin  40 mg and Imdur  30 mg, which had been effective until the past month - No recent echocardiogram since 2023  Dyspnea - Shortness of breath during normal activities despite being physically active - Cold weather exacerbates shortness of breath - Engages in Tai Chi classes twice a week, practices at home, does 100 sit-ups daily, walks regularly, uses the elliptical, and participates in weight training  Edema - Water retention, more pronounced in the right leg - Uses compression socks for management  Cardiovascular risk factors and family history - Family history of heart disease: paternal grandfather died of heart attack at 47, mother had mitral valve repair at 55 - No tobacco use - No known allergies to contrast  Lipid profile - Lipid panel on August 04, 2024:  total cholesterol 99, HDL 40, LDL 35, triglycerides 118, VLDL 23          ROS: Remaining review of systems negative  Studies Reviewed: SABRA   EKG Interpretation Date/Time:  Friday October 23 2024 10:46:04 EST Ventricular Rate:  62 PR Interval:  168 QRS Duration:  72 QT Interval:  422 QTC Calculation: 428 R Axis:   74  Text Interpretation: Normal sinus rhythm Low voltage QRS Otherwise normal ECG When compared with ECG of 01-Oct-2023 13:48, No significant change was found Confirmed by Calender Emeline [phone]) on 10/23/2024 10:53:51 AM    Results Labs Lipid panel (08/04/2024): Total cholesterol 99, HDL 40, LDL 35, triglycerides 118, VLDL 23  Diagnostic Coronary angiography (05/25/2022): Normal left main; minimal luminal irregularities of remaining coronary arteries; no obstructive coronary artery disease Echocardiogram (2023): Normal left ventricular and right ventricular function Electrocardiogram (EKG) (2023): Poor R wave progression, normal sinus rhythm, possible anterior infarct, low voltage Risk Assessment/Calculations:             Physical Exam:   VS:  BP (!) 92/50 (BP Location: Right Arm, Patient Position: Sitting, Cuff Size: Normal)   Pulse 62   Ht 5' 2 (1.575 m)   Wt 154 lb 12.8 oz (70.2 kg)   SpO2 96%   BMI 28.31 kg/m    Wt Readings from Last 3 Encounters:  10/23/24 154 lb 12.8 oz (70.2 kg)  08/04/24 149 lb 8 oz (67.8 kg)  06/18/24 146 lb (66.2 kg)    GEN: Well nourished, well developed in no acute distress NECK: No JVD; No carotid bruits CARDIAC:  RRR,  no murmurs, no rubs, no gallops RESPIRATORY:  Clear to auscultation without rales, wheezing or rhonchi  ABDOMEN: Soft, non-tender, non-distended EXTREMITIES:  No edema; No deformity   ASSESSMENT AND PLAN: .    Assessment and Plan Assessment & Plan Chest pain, Differential diagnosis notably includes vasospastic angina, microvascular disease, esophageal spasm, vocal cord spasm, etc. 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 Symptoms include episodes of hypertension and shortness of breath, relieved by sublingual nitroglycerin . Previous coronary angiography showed no obstructive coronary disease and is able to tolerate a significant amount of exertion as noted above. Consideration of microvascular dysfunction or anatomical issues in coronary arteries. - Ordered echocardiogram. - Advised taking Imdur  in the morning rather than evening. - Ordered treadmill stress test. If stress test is abnormal, will order coronary CT scan. - Will consider referral to pulmonologist and/or GI or other specialist if cardiac workup is negative. - If further chest pain workup is negative from other specialists then can consider a microvascular workup  Hyperlipidemia Well-controlled with atorvastatin . Recent lipid panel shows total cholesterol 99, HDL 40, LDL 35, triglycerides 118, VLDL 23.  Hypertension Episodes of elevated blood pressure, particularly in the afternoons. Blood pressure today was low at 92/50. Current management includes Imdur , which may contribute to hypotension. - Advised taking Imdur  in the morning.       Informed Consent   Shared Decision Making/Informed Consent The risks [chest pain, shortness of breath, cardiac arrhythmias, dizziness, blood pressure fluctuations, myocardial infarction, stroke/transient ischemic attack, and life-threatening complications (estimated to be 1 in 10,000)], benefits (risk stratification, diagnosing coronary artery disease, treatment guidance) and alternatives of an exercise tolerance test were discussed in detail with [name] and she agrees to proceed.       Follow up: 4 to 6 weeks  Signed, Khalib Fendley E Hildred Mollica, DO  10/23/2024 12:13 PM    [facility] "

## 2024-10-30 ENCOUNTER — Encounter: Payer: Self-pay | Admitting: Family Medicine

## 2024-12-04 ENCOUNTER — Encounter (HOSPITAL_COMMUNITY)

## 2024-12-04 ENCOUNTER — Ambulatory Visit (HOSPITAL_COMMUNITY)

## 2024-12-14 ENCOUNTER — Ambulatory Visit: Admitting: Internal Medicine
